# Patient Record
Sex: Female | Born: 1950
Health system: Southern US, Community
[De-identification: ages and names within clinical notes are randomized; demographics above are authoritative.]

## PROBLEM LIST (undated history)

## (undated) DIAGNOSIS — Z975 Presence of (intrauterine) contraceptive device: Secondary | ICD-10-CM

## (undated) DIAGNOSIS — Z3046 Encounter for surveillance of implantable subdermal contraceptive: Secondary | ICD-10-CM

## (undated) DIAGNOSIS — R87613 High grade squamous intraepithelial lesion on cytologic smear of cervix (HGSIL): Secondary | ICD-10-CM

## (undated) HISTORY — DX: Presence of (intrauterine) contraceptive device: Z97.5

## (undated) HISTORY — DX: High grade squamous intraepithelial lesion on cytologic smear of cervix (HGSIL): R87.613

## (undated) HISTORY — DX: Encounter for surveillance of implantable subdermal contraceptive: Z30.46

---

## 1989-03-28 DIAGNOSIS — Z975 Presence of (intrauterine) contraceptive device: Secondary | ICD-10-CM

## 1989-03-28 DIAGNOSIS — R87613 High grade squamous intraepithelial lesion on cytologic smear of cervix (HGSIL): Secondary | ICD-10-CM

## 1989-03-28 HISTORY — DX: Presence of (intrauterine) contraceptive device: Z97.5

## 1989-03-28 HISTORY — DX: High grade squamous intraepithelial lesion on cytologic smear of cervix (HGSIL): R87.613

## 1989-03-28 HISTORY — PX: CERVICAL CONE BIOPSY: SUR198

## 1995-03-29 DIAGNOSIS — Z3046 Encounter for surveillance of implantable subdermal contraceptive: Secondary | ICD-10-CM

## 1995-03-29 HISTORY — DX: Encounter for surveillance of implantable subdermal contraceptive: Z30.46

## 1999-01-06 ENCOUNTER — Other Ambulatory Visit: Admission: RE | Admit: 1999-01-06 | Discharge: 1999-01-06 | Payer: Self-pay | Admitting: Obstetrics and Gynecology

## 2000-03-03 ENCOUNTER — Other Ambulatory Visit: Admission: RE | Admit: 2000-03-03 | Discharge: 2000-03-03 | Payer: Self-pay | Admitting: Obstetrics and Gynecology

## 2001-02-09 ENCOUNTER — Other Ambulatory Visit: Admission: RE | Admit: 2001-02-09 | Discharge: 2001-02-09 | Payer: Self-pay | Admitting: Obstetrics and Gynecology

## 2002-02-12 ENCOUNTER — Other Ambulatory Visit: Admission: RE | Admit: 2002-02-12 | Discharge: 2002-02-12 | Payer: Self-pay | Admitting: Obstetrics and Gynecology

## 2003-02-18 ENCOUNTER — Other Ambulatory Visit: Admission: RE | Admit: 2003-02-18 | Discharge: 2003-02-18 | Payer: Self-pay | Admitting: Obstetrics and Gynecology

## 2004-01-30 ENCOUNTER — Ambulatory Visit (HOSPITAL_COMMUNITY): Admission: RE | Admit: 2004-01-30 | Discharge: 2004-01-30 | Payer: Self-pay | Admitting: Gastroenterology

## 2004-01-30 ENCOUNTER — Encounter (INDEPENDENT_AMBULATORY_CARE_PROVIDER_SITE_OTHER): Payer: Self-pay | Admitting: *Deleted

## 2004-04-21 ENCOUNTER — Other Ambulatory Visit: Admission: RE | Admit: 2004-04-21 | Discharge: 2004-04-21 | Payer: Self-pay | Admitting: Obstetrics and Gynecology

## 2005-08-26 ENCOUNTER — Other Ambulatory Visit: Admission: RE | Admit: 2005-08-26 | Discharge: 2005-08-26 | Payer: Self-pay | Admitting: Obstetrics and Gynecology

## 2006-11-21 ENCOUNTER — Other Ambulatory Visit: Admission: RE | Admit: 2006-11-21 | Discharge: 2006-11-21 | Payer: Self-pay | Admitting: Obstetrics and Gynecology

## 2007-12-28 ENCOUNTER — Other Ambulatory Visit: Admission: RE | Admit: 2007-12-28 | Discharge: 2007-12-28 | Payer: Self-pay | Admitting: Obstetrics and Gynecology

## 2009-05-12 LAB — HM PAP SMEAR: HM Pap smear: NEGATIVE

## 2010-08-13 NOTE — Op Note (Signed)
NAMECAMILIA, Sonya Newton                 ACCOUNT NO.:  [id number]   MEDICAL RECORD NO.:  [id number]          PATIENT TYPE:  AMB   LOCATION:  ENDO                         FACILITY:  [facility]   PHYSICIAN:  John C. Madilyn Fireman, M.D.    DATE OF BIRTH:  1950/12/24   DATE OF PROCEDURE:  01/30/2004  DATE OF DISCHARGE:                                 OPERATIVE REPORT   PROCEDURE:  Colonoscopy with polypectomy.   INDICATIONS FOR PROCEDURE:  Average risk colon cancer screening.   DESCRIPTION OF PROCEDURE:  The patient was placed in the left lateral  decubitus position then placed on the pulse monitor with continuous low flow  oxygen delivered by nasal cannula. She was sedated with 75 mcg IV fentanyl  and 8 mg IV Versed. The Olympus video colonoscope was inserted into the  rectum and advanced to the cecum, confirmed by transillumination at  McBurney's point and visualization of the ileocecal valve and appendiceal  orifice. The prep was excellent. The cecum appeared normal with no masses,  polyps, diverticula or other mucosal abnormalities.  Within the ascending  colon, there was a 6 mm polyp that was removed by hot biopsy. The remainder  of the ascending, transverse, descending and sigmoid colon appeared normal  with no masses, polyps, diverticula or other mucosal abnormalities. The  rectum likewise appeared normal and retroflexed view of the anus revealed no  obvious internal hemorrhoids. The scope was then withdrawn and the patient  returned to the recovery room in stable condition. She tolerated the  procedure well and there were no immediate complications.   IMPRESSION:  Small ascending colon polyp otherwise normal study.   PLAN:  Await histology to determine method and interval for future colon  screening.      JCH/MEDQ  D:  01/30/2004  T:  01/30/2004  Job:  [id number]   cc:   Caryn Bee L. Little, M.D.  [address]  [place]  [place] [address]  Fax: [phone]

## 2012-02-02 LAB — HM MAMMOGRAPHY: HM Mammogram: NORMAL

## 2012-05-14 ENCOUNTER — Encounter: Payer: Self-pay | Admitting: Obstetrics and Gynecology

## 2012-06-20 ENCOUNTER — Ambulatory Visit (INDEPENDENT_AMBULATORY_CARE_PROVIDER_SITE_OTHER): Payer: BC Managed Care – PPO | Admitting: Obstetrics and Gynecology

## 2012-06-20 ENCOUNTER — Other Ambulatory Visit: Payer: Self-pay | Admitting: Obstetrics and Gynecology

## 2012-06-20 ENCOUNTER — Encounter: Payer: Self-pay | Admitting: Obstetrics and Gynecology

## 2012-06-20 VITALS — BP 118/80 | Ht 63.0 in | Wt 164.0 lb

## 2012-06-20 DIAGNOSIS — Z01419 Encounter for gynecological examination (general) (routine) without abnormal findings: Secondary | ICD-10-CM

## 2012-06-20 MED ORDER — CITALOPRAM HYDROBROMIDE 20 MG PO TABS: 20.0000 mg | ORAL_TABLET | Freq: Every day | ORAL | Status: DC

## 2012-06-20 NOTE — Patient Instructions (Signed)

## 2012-06-20 NOTE — Progress Notes (Signed)
62 y.o. MarriedCaucasian female   G1P1 here for annual exam.  Has a collapsed disc in lower back but no intervention needed yet.  Still walking fast for exercise.  Tried to d/c citalopram, but had recurrence of anxiety and insomnia, so went back on it.    No LMP recorded. Patient is postmenopausal.          Sexually active: yes  The current method of family planning is vasectomy.    Exercising: }walking 4 days a week Last mammogram:  02/02/2012 normal Last pap smear: 05/12/2009 neg History of abnormal pap: 1991 CIN 3 Smoking:no Alcohol: 4-5 beers a week Last colonoscopy:2005 normal q 34yrs Last Bone Density:  none Last tetanus shot: 3 weeks ago Last cholesterol check: 3 weeks ago normal  Hgb:       pcp         Urine:pcp    Health Maintenance  Topic Date Due  . Influenza Vaccine  11/26/1950  . Pap Smear  10/17/1970  . Mammogram  10/17/2002  . Colonoscopy  10/17/2002  . Tetanus/tdap  03/29/2015    Family History  Problem Relation Age of Onset  . Hypertension Mother   . Cancer Father 85    liver, pancreatic ca  . Hypertension Brother     There is no problem list on file for this patient.   Past Medical History  Diagnosis Date  . IUD contraception 1991    removed 1991  . Encounter for Norplant removal 1997  . Pap smear abnormality of cervix with HGSIL 1991    CKC positive endocervical margin    Past Surgical History  Procedure Laterality Date  . Cervical cone biopsy  1991    Allergies: Review of patient's allergies indicates no known allergies.  Current Outpatient Prescriptions  Medication Sig Dispense Refill  . calcium carbonate (OS-CAL) 600 MG TABS Take 600 mg by mouth daily.      Marland Kitchen loratadine (CLARITIN) 10 MG tablet Take 10 mg by mouth daily.      . meloxicam (MOBIC) 7.5 MG tablet       . MULTIPLE VITAMIN PO Take 1 tablet by mouth daily.       No current facility-administered medications for this visit.    ROS: Pertinent items are noted in HPI.  Exam:     BP 118/80  Ht 5\' 3"  (1.6 m)  Wt 164 lb (74.39 kg)  BMI 29.06 kg/m2  Weight is up 7 pounds since 06/17/2011 Wt Readings from Last 3 Encounters:  06/20/12 164 lb (74.39 kg)     Ht Readings from Last 3 Encounters:  06/20/12 5\' 3"  (1.6 m)  Height is stable.    General appearance: alert, cooperative and appears stated age Head: Normocephalic, without obvious abnormality, atraumatic Neck: no adenopathy, supple, symmetrical, trachea midline and thyroid not enlarged, symmetric, no tenderness/mass/nodules Lungs: clear to auscultation bilaterally Breasts: Inspection negative, No nipple retraction or dimpling, No nipple discharge or bleeding, No axillary or supraclavicular adenopathy, Normal to palpation without dominant masses Heart: regular rate and rhythm Abdomen: soft, non-tender; bowel sounds normal; no masses,  no organomegaly Extremities: extremities normal, atraumatic, no cyanosis or edema Skin: Skin color, texture, turgor normal. No rashes 3 cm lipoma right upper back Lymph nodes: Cervical, supraclavicular, and axillary nodes normal. No abnormal inguinal nodes palpated Neurologic: Grossly normal   Pelvic: External genitalia:  no lesions              Urethra:  normal appearing urethra with no masses, tenderness  or lesions              Bartholins and Skenes: normal                 Vagina: normal appearing vagina with normal color and discharge, no lesions              Cervix: normal appearance              Pap taken: yes        Bimanual Exam:  Uterus:  uterus is normal size, shape, consistency and nontender. Mid position                                      Adnexa: normal adnexa in size, nontender and no masses                                      Rectovaginal: Confirms                                      Anus:  normal sphincter tone, no lesions  A: nl menopausal exam, no HRT     H/O CIN 3 with CKC in 1991, nl paps since.  Pap due today.     Anxiety, tx/d with citalopram      P: mammogram pap smear counseled on breast self exam, mammography screening, adequate intake of calcium and vitamin D, diet and exercise return annually or prn     An After Visit Summary was printed and given to the patient.

## 2012-06-21 LAB — IPS PAP TEST WITH HPV

## 2012-06-29 ENCOUNTER — Encounter: Payer: Self-pay | Admitting: Obstetrics and Gynecology

## 2012-12-27 ENCOUNTER — Encounter: Payer: Self-pay | Admitting: Obstetrics and Gynecology

## 2013-03-28 HISTORY — PX: CYSTECTOMY: SUR359

## 2013-06-28 ENCOUNTER — Ambulatory Visit: Payer: BC Managed Care – PPO | Admitting: Obstetrics and Gynecology

## 2013-07-01 ENCOUNTER — Ambulatory Visit: Payer: BC Managed Care – PPO | Admitting: Obstetrics and Gynecology

## 2013-07-02 ENCOUNTER — Other Ambulatory Visit: Payer: Self-pay | Admitting: *Deleted

## 2013-07-02 NOTE — Telephone Encounter (Signed)
Incoming Fax form [facility] requesting Celexa refill  Last AEX 06/20/2012 Last refill 06/20/2012 #30/12 refills  Next AEX scheduled for 11/06/2013  Ok to refill Rx until August? Paper Chart in your door.  Please approve or deny Rx.

## 2013-07-03 MED ORDER — CITALOPRAM HYDROBROMIDE 20 MG PO TABS: 20.0000 mg | ORAL_TABLET | Freq: Every day | ORAL | Status: DC

## 2013-10-31 ENCOUNTER — Telehealth: Payer: Self-pay | Admitting: Obstetrics and Gynecology

## 2013-10-31 NOTE — Telephone Encounter (Signed)
Confirming pts appt lm with man to return call

## 2013-11-06 ENCOUNTER — Encounter: Payer: Self-pay | Admitting: Obstetrics and Gynecology

## 2013-11-06 ENCOUNTER — Ambulatory Visit (INDEPENDENT_AMBULATORY_CARE_PROVIDER_SITE_OTHER): Payer: Managed Care, Other (non HMO) | Admitting: Obstetrics and Gynecology

## 2013-11-06 VITALS — BP 110/70 | HR 88 | Resp 16 | Ht 62.5 in | Wt 163.0 lb

## 2013-11-06 DIAGNOSIS — Z01419 Encounter for gynecological examination (general) (routine) without abnormal findings: Secondary | ICD-10-CM

## 2013-11-06 DIAGNOSIS — Z1211 Encounter for screening for malignant neoplasm of colon: Secondary | ICD-10-CM

## 2013-11-06 NOTE — Progress Notes (Signed)
Patient ID: Sonya Newton, female   DOB: 04/02/1950, 63 y.o.   MRN: [id number] GYNECOLOGY VISIT  PCP:   Catha Gosselin, MD  Referring provider:   HPI: 63 y.o.   Married  Caucasian  female   G1P1 with Patient's last menstrual period was 03/28/2002.   here for  AEX.  Patient on [employer].  Trying not to do a lot of testing this year.  Having back problems and had to stop running.  Treated with prednisone 12 day course and Meloxicam prn.  Walking 5 days a week.  "Taking medication for sleep." Ran out of Citalopram and was taking it prn to help with sleep. Had not taken it for a while.  Less stress not that she is not working.  Sleeping also better since back is feeling better.  Declines Rx for insomnia/anxiety.   Hgb:    PCP Urine:  PCP  GYNECOLOGIC HISTORY: Patient's last menstrual period was 03/28/2002. Sexually active:  yes Partner preference: female Contraception: vasectomy/postmenopausal   Menopausal hormone therapy:  none DES exposure:  no  Blood transfusions:   no Sexually transmitted diseases:  ?HPV GYN procedures and prior surgeries:  Conization of cervix 1991 Last mammogram:  02-12-13 WUJ:WJXBJ.          Last pap and high risk HPV testing: 06-20-12 - WNL, negative HR HPV. History of abnormal pap smear:  1991 CIN 3--Cone Biopsy--had positive margin of endocervix.  CKC--Positive endocervical margin. OB History   Grav Para Term Preterm Abortions TAB SAB Ect Mult Living   1 1        1        LIFESTYLE: Exercise:    Walks 5 miles per week          Tobacco:    Former smoker Alcohol:    6 beers per week Drug use:  No  OTHER HEALTH MAINTENANCE: Tetanus/TDap:  2014 Gardisil:            n/a Influenza:         12/2012 Zostavax:         n/a  Bone density:   Declines.  Colonoscopy:   2005 normal with [facility].  Patient is due this year.  Cholesterol check: 05/2012 normal with PCP  Family History  Problem Relation Age of Onset  . Hypertension Mother   . Cancer  Father 19    liver, pancreatic ca  . Hypertension Brother     There are no active problems to display for this patient.  Past Medical History  Diagnosis Date  . IUD contraception 1991    removed 1991  . Encounter for Norplant removal 1997  . Pap smear abnormality of cervix with HGSIL 1991    CKC positive endocervical margin    Past Surgical History  Procedure Laterality Date  . Cervical cone biopsy  1991    ALLERGIES: Review of patient's allergies indicates no known allergies.  Current Outpatient Prescriptions  Medication Sig Dispense Refill  . calcium carbonate (OS-CAL) 600 MG TABS Take 600 mg by mouth daily.      Marland Kitchen loratadine (CLARITIN) 10 MG tablet Take 10 mg by mouth daily.      . meloxicam (MOBIC) 7.5 MG tablet Take 7.5 mg by mouth as needed.       . MULTIPLE VITAMIN PO Take 1 tablet by mouth daily.      . citalopram (CELEXA) 20 MG tablet Take 1 tablet (20 mg total) by mouth daily.  30 tablet  4  .  predniSONE (STERAPRED UNI-PAK) 5 MG TABS tablet Take 1 tablet by mouth daily. Tapered dose       No current facility-administered medications for this visit.     ROS:  Pertinent items are noted in HPI.  History   Social History  . Marital Status: Married    Spouse Name: N/A    Number of Children: N/A  . Years of Education: N/A   Occupational History  . Not on file.   Social History Main Topics  . Smoking status: Former Smoker    Quit date: 06/21/1982  . Smokeless tobacco: Not on file  . Alcohol Use: 3.6 oz/week    6 Cans of beer per week     Comment: 6 beers per week  . Drug Use: No  . Sexual Activity: Yes    Partners: Male    Birth Control/ Protection: Other-see comments, Post-menopausal     Comment: vasectomy   Other Topics Concern  . Not on file   Social History Narrative  . No narrative on file    PHYSICAL EXAMINATION:    BP 110/70  Pulse 88  Resp 16  Ht 5' 2.5" (1.588 m)  Wt 163 lb (73.936 kg)  BMI 29.32 kg/m2  LMP 03/28/2002   Wt  Readings from Last 3 Encounters:  11/06/13 163 lb (73.936 kg)  06/20/12 164 lb (74.39 kg)     Ht Readings from Last 3 Encounters:  11/06/13 5' 2.5" (1.588 m)  06/20/12 5\' 3"  (1.6 m)    General appearance: alert, cooperative and appears stated age Head: Normocephalic, without obvious abnormality, atraumatic Neck: no adenopathy, supple, symmetrical, trachea midline and thyroid not enlarged, symmetric, no tenderness/mass/nodules Lungs: clear to auscultation bilaterally Breasts: Inspection negative, No nipple retraction or dimpling, No nipple discharge or bleeding, No axillary or supraclavicular adenopathy, Normal to palpation without dominant masses Heart: regular rate and rhythm Abdomen: soft, non-tender; no masses,  no organomegaly Extremities: extremities normal, atraumatic, no cyanosis or edema Skin: Skin color, texture, turgor normal. No rashes or lesions Lymph nodes: Cervical, supraclavicular, and axillary nodes normal. No abnormal inguinal nodes palpated Neurologic: Grossly normal  Pelvic: External genitalia:  no lesions              Urethra:  normal appearing urethra with no masses, tenderness or lesions              Bartholins and Skenes: normal                 Vagina: normal appearing vagina with normal color and discharge, no lesions              Cervix: normal appearance. Stenotic cervix.              Pap and high risk HPV testing done: Yes.              Bimanual Exam:  Uterus:  uterus is normal size, shape, consistency and nontender                                      Adnexa: normal adnexa in size, nontender and no masses                                      Rectovaginal: yes  Confirms above.                                      Anus:  normal sphincter tone, no lesions  ASSESSMENT  Normal gynecologic exam. History of cold knife conization with positive margin 1991 - CIN 3. History of sleep disturbance and anxiety while was working.   Due for colonoscopy but is not sure when she will do it.   PLAN  Mammogram recommended yearly starting at age 21. Pap smear and high risk HPV testing as above. Counseled on self breast exam, Calcium and vitamin D intake, exercise. See lab orders yes.  IFOB. Declines Rx for insomnia.  Re-educated that Citalopram is not a sleeping aid to be used prn but that there are other options available. Return annually or prn   An After Visit Summary was printed and given to the patient.

## 2013-11-06 NOTE — Patient Instructions (Signed)

## 2013-11-08 LAB — IPS PAP TEST WITH HPV

## 2013-11-18 ENCOUNTER — Telehealth: Payer: Self-pay

## 2013-11-18 NOTE — Telephone Encounter (Signed)
Message copied by Jannet Askew on Mon Nov 18, 2013  5:06 PM ------      Message from: Ricki Miller DE Gwenevere Ghazi, BROOK E      Created: Sun Nov 17, 2013  3:39 PM       Please give the patient a gentle reminder to send in the IFOB - hemoccult stool testing.             Thanks,             Conley Simmonds      ----- Message -----         From: SYSTEM         Sent: 11/11/2013  12:01 AM           To: Brook E Amundson de Gwenevere Ghazi, MD                   ------

## 2013-11-18 NOTE — Telephone Encounter (Signed)
Spoke with patient. Advised of reminder as seen below from Dr.Silva. Patient is agreeable and verbalizes understanding.  Routing to provider for final review. Patient agreeable to disposition. Will close encounter

## 2014-01-27 ENCOUNTER — Encounter: Payer: Self-pay | Admitting: Obstetrics and Gynecology

## 2014-02-14 ENCOUNTER — Telehealth: Payer: Self-pay | Admitting: Obstetrics and Gynecology

## 2014-02-14 NOTE — Telephone Encounter (Signed)
Left message regarding appointment 10/2014 has been canceled and needs to be rescheduled.

## 2014-11-14 ENCOUNTER — Ambulatory Visit: Payer: Managed Care, Other (non HMO) | Admitting: Obstetrics and Gynecology

## 2014-11-17 ENCOUNTER — Ambulatory Visit: Payer: Managed Care, Other (non HMO) | Admitting: Obstetrics and Gynecology

## 2015-02-05 ENCOUNTER — Encounter: Payer: Self-pay | Admitting: Obstetrics and Gynecology

## 2015-02-05 ENCOUNTER — Ambulatory Visit (INDEPENDENT_AMBULATORY_CARE_PROVIDER_SITE_OTHER): Payer: 59 | Admitting: Obstetrics and Gynecology

## 2015-02-05 VITALS — BP 130/90 | HR 90 | Resp 18 | Ht 62.5 in | Wt 163.8 lb

## 2015-02-05 DIAGNOSIS — Z01419 Encounter for gynecological examination (general) (routine) without abnormal findings: Secondary | ICD-10-CM | POA: Diagnosis not present

## 2015-02-05 NOTE — Progress Notes (Signed)
Patient ID: Sonya Newton, female   DOB: Dec 06, 1950, 64 y.o.   MRN: [id number] 64 y.o. G1P1 Married Caucasian female here for annual exam.    Denies vaginal bleeding or spotting.  Having vaginal dryness.  Back to running again.  Has run a marathon in the past.   [place]  Mother is 683 and doing well.   PCP:  Catha GosselinKevin Little, MD  Patient's last menstrual period was 03/28/2002.          Sexually active: Yes.   female The current method of family planning is vasectomy.    Exercising: Yes.    Runs 15-20 miles weekly. Smoker:  former  Health Maintenance: Pap:  11-06-13 Neg:Neg HR HPV History of abnormal Pap:  Yes, 1991 Hx cone Bx -- CIN 3 and had positive margin of endocervix.  CKC--positive endocervical margin. MMG:  02-19-14 Density Cat.C/benign calcifications Rt.breast,benign lymph node Lt.breast/Neg/BiRads2:[facility].  Has appointment for the end of November 2016. Colonoscopy:  2014 normal with [facility].  Next scheduled for 06/2015. BMD:   never  Result  n/a TDaP:  2014 Screening Labs:  Hb today: PCP, Urine today: PCP   reports that she quit smoking about 32 years ago. She does not have any smokeless tobacco history on file. She reports that she drinks about 3.6 oz of alcohol per week. She reports that she does not use illicit drugs.  Past Medical History  Diagnosis Date  . IUD contraception 1991    removed 1991  . Encounter for Norplant removal 1997  . Pap smear abnormality of cervix with HGSIL 1991    CKC positive endocervical margin    Past Surgical History  Procedure Laterality Date  . Cervical cone biopsy  1991  . Cystectomy  2015    --benign back cyst    Current Outpatient Prescriptions  Medication Sig Dispense Refill  . calcium carbonate (OS-CAL) 600 MG TABS Take 600 mg by mouth daily.    Marland Kitchen. loratadine (CLARITIN) 10 MG tablet Take 10 mg by mouth daily.    . MULTIPLE VITAMIN PO Take 1 tablet by mouth daily.     No current facility-administered medications for  this visit.    Family History  Problem Relation Age of Onset  . Hypertension Mother   . Cancer Father [id number]    liver, pancreatic ca  . Hypertension Brother     ROS:  Pertinent items are noted in HPI.  Otherwise, a comprehensive ROS was negative.  Exam:   BP 130/90 mmHg  Pulse 90  Resp 18  Ht 5' 2.5" (1.588 m)  Wt 163 lb 12.8 oz (74.299 kg)  BMI 29.46 kg/m2  LMP 03/28/2002    General appearance: alert, cooperative and appears stated age Head: Normocephalic, without obvious abnormality, atraumatic Neck: no adenopathy, supple, symmetrical, trachea midline and thyroid normal to inspection and palpation Lungs: clear to auscultation bilaterally Breasts: normal appearance, no masses or tenderness, Inspection negative, No nipple retraction or dimpling, No nipple discharge or bleeding, No axillary or supraclavicular adenopathy Heart: regular rate and rhythm Abdomen: soft, non-tender; bowel sounds normal; no masses,  no organomegaly Extremities: extremities normal, atraumatic, no cyanosis or edema Skin: Skin color, texture, turgor normal. No rashes or lesions Lymph nodes: Cervical, supraclavicular, and axillary nodes normal. No abnormal inguinal nodes palpated Neurologic: Grossly normal  Pelvic: External genitalia:  no lesions              Urethra:  normal appearing urethra with no masses, tenderness or lesions  Bartholins and Skenes: normal                 Vagina: normal appearing vagina with normal color and discharge, no lesions              Cervix: no lesions and atrophic and bleeding with pap.              Pap taken: Yes.   Bimanual Exam:  Uterus:  normal size, contour, position, consistency, mobility, non-tender              Adnexa: normal adnexa and no mass, fullness, tenderness              Rectovaginal: Yes.  .  Confirms.              Anus:  normal sphincter tone, no lesions  Chaperone was present for exam.  Assessment:   Well woman visit with normal exam. Hx  of cold knife conization with CIN III and positive margin 1991.  Plan: Yearly mammogram recommended after age 65.  Recommended self breast exam.  Pap and HR HPV as above.   If this pap is normal, next pap in 3 years. Discussed Calcium, Vitamin D, regular exercise program including cardiovascular and weight bearing exercise. Labs performed.  No..     Refills given on medications.  No..    Follow up annually and prn.      After visit summary provided.

## 2015-02-05 NOTE — Patient Instructions (Signed)

## 2015-02-11 LAB — IPS PAP TEST WITH HPV

## 2017-11-01 DIAGNOSIS — K219 Gastro-esophageal reflux disease without esophagitis: Secondary | ICD-10-CM | POA: Diagnosis not present

## 2017-11-01 DIAGNOSIS — Z79899 Other long term (current) drug therapy: Secondary | ICD-10-CM | POA: Diagnosis not present

## 2017-11-01 DIAGNOSIS — R7301 Impaired fasting glucose: Secondary | ICD-10-CM | POA: Diagnosis not present

## 2017-11-01 DIAGNOSIS — J309 Allergic rhinitis, unspecified: Secondary | ICD-10-CM | POA: Diagnosis not present

## 2017-11-01 DIAGNOSIS — E785 Hyperlipidemia, unspecified: Secondary | ICD-10-CM | POA: Diagnosis not present

## 2017-11-01 DIAGNOSIS — Z0000 Encounter for general adult medical examination without abnormal findings: Secondary | ICD-10-CM | POA: Diagnosis not present

## 2017-11-01 DIAGNOSIS — R8290 Unspecified abnormal findings in urine: Secondary | ICD-10-CM | POA: Diagnosis not present

## 2017-11-01 DIAGNOSIS — Z23 Encounter for immunization: Secondary | ICD-10-CM | POA: Diagnosis not present

## 2017-11-01 DIAGNOSIS — Z136 Encounter for screening for cardiovascular disorders: Secondary | ICD-10-CM | POA: Diagnosis not present

## 2017-12-13 DIAGNOSIS — R69 Illness, unspecified: Secondary | ICD-10-CM | POA: Diagnosis not present

## 2018-02-14 DIAGNOSIS — E785 Hyperlipidemia, unspecified: Secondary | ICD-10-CM | POA: Diagnosis not present

## 2018-02-14 DIAGNOSIS — Z79899 Other long term (current) drug therapy: Secondary | ICD-10-CM | POA: Diagnosis not present

## 2018-04-10 DIAGNOSIS — Z1231 Encounter for screening mammogram for malignant neoplasm of breast: Secondary | ICD-10-CM | POA: Diagnosis not present

## 2018-09-24 DIAGNOSIS — H5213 Myopia, bilateral: Secondary | ICD-10-CM | POA: Diagnosis not present

## 2018-10-01 DIAGNOSIS — Z0100 Encounter for examination of eyes and vision without abnormal findings: Secondary | ICD-10-CM | POA: Diagnosis not present

## 2018-11-07 DIAGNOSIS — E785 Hyperlipidemia, unspecified: Secondary | ICD-10-CM | POA: Diagnosis not present

## 2018-11-07 DIAGNOSIS — R7301 Impaired fasting glucose: Secondary | ICD-10-CM | POA: Diagnosis not present

## 2018-11-07 DIAGNOSIS — K219 Gastro-esophageal reflux disease without esophagitis: Secondary | ICD-10-CM | POA: Diagnosis not present

## 2018-11-07 DIAGNOSIS — J309 Allergic rhinitis, unspecified: Secondary | ICD-10-CM | POA: Diagnosis not present

## 2018-11-07 DIAGNOSIS — Z79899 Other long term (current) drug therapy: Secondary | ICD-10-CM | POA: Diagnosis not present

## 2018-12-10 DIAGNOSIS — R69 Illness, unspecified: Secondary | ICD-10-CM | POA: Diagnosis not present

## 2019-09-09 DIAGNOSIS — Z1231 Encounter for screening mammogram for malignant neoplasm of breast: Secondary | ICD-10-CM | POA: Diagnosis not present

## 2019-11-01 DIAGNOSIS — E785 Hyperlipidemia, unspecified: Secondary | ICD-10-CM | POA: Diagnosis not present

## 2019-11-01 DIAGNOSIS — R8290 Unspecified abnormal findings in urine: Secondary | ICD-10-CM | POA: Diagnosis not present

## 2019-11-01 DIAGNOSIS — Z1159 Encounter for screening for other viral diseases: Secondary | ICD-10-CM | POA: Diagnosis not present

## 2019-11-01 DIAGNOSIS — Z79899 Other long term (current) drug therapy: Secondary | ICD-10-CM | POA: Diagnosis not present

## 2019-11-01 DIAGNOSIS — Z0000 Encounter for general adult medical examination without abnormal findings: Secondary | ICD-10-CM | POA: Diagnosis not present

## 2019-11-01 DIAGNOSIS — J309 Allergic rhinitis, unspecified: Secondary | ICD-10-CM | POA: Diagnosis not present

## 2019-11-01 DIAGNOSIS — K219 Gastro-esophageal reflux disease without esophagitis: Secondary | ICD-10-CM | POA: Diagnosis not present

## 2019-11-01 DIAGNOSIS — Z13820 Encounter for screening for osteoporosis: Secondary | ICD-10-CM | POA: Diagnosis not present

## 2019-11-01 DIAGNOSIS — Z23 Encounter for immunization: Secondary | ICD-10-CM | POA: Diagnosis not present

## 2019-11-01 DIAGNOSIS — R7301 Impaired fasting glucose: Secondary | ICD-10-CM | POA: Diagnosis not present

## 2019-11-28 DIAGNOSIS — K644 Residual hemorrhoidal skin tags: Secondary | ICD-10-CM | POA: Diagnosis not present

## 2019-12-21 ENCOUNTER — Emergency Department (HOSPITAL_COMMUNITY): Payer: Medicare HMO

## 2019-12-21 ENCOUNTER — Emergency Department (HOSPITAL_COMMUNITY)
Admission: EM | Admit: 2019-12-21 | Discharge: 2019-12-21 | Disposition: A | Discharge status: Home / Self Care | Payer: Medicare HMO | Attending: Emergency Medicine | Admitting: Emergency Medicine

## 2019-12-21 ENCOUNTER — Encounter (HOSPITAL_COMMUNITY): Payer: Self-pay | Admitting: Emergency Medicine

## 2019-12-21 DIAGNOSIS — S4992XA Unspecified injury of left shoulder and upper arm, initial encounter: Secondary | ICD-10-CM | POA: Diagnosis present

## 2019-12-21 DIAGNOSIS — W19XXXA Unspecified fall, initial encounter: Secondary | ICD-10-CM | POA: Diagnosis not present

## 2019-12-21 DIAGNOSIS — W01198A Fall on same level from slipping, tripping and stumbling with subsequent striking against other object, initial encounter: Secondary | ICD-10-CM | POA: Insufficient documentation

## 2019-12-21 DIAGNOSIS — Y998 Other external cause status: Secondary | ICD-10-CM | POA: Insufficient documentation

## 2019-12-21 DIAGNOSIS — S199XXA Unspecified injury of neck, initial encounter: Secondary | ICD-10-CM | POA: Diagnosis not present

## 2019-12-21 DIAGNOSIS — M25519 Pain in unspecified shoulder: Secondary | ICD-10-CM | POA: Diagnosis not present

## 2019-12-21 DIAGNOSIS — R079 Chest pain, unspecified: Secondary | ICD-10-CM | POA: Diagnosis not present

## 2019-12-21 DIAGNOSIS — Y9289 Other specified places as the place of occurrence of the external cause: Secondary | ICD-10-CM | POA: Diagnosis not present

## 2019-12-21 DIAGNOSIS — S42032A Displaced fracture of lateral end of left clavicle, initial encounter for closed fracture: Secondary | ICD-10-CM

## 2019-12-21 DIAGNOSIS — Y93G9 Activity, other involving cooking and grilling: Secondary | ICD-10-CM | POA: Insufficient documentation

## 2019-12-21 DIAGNOSIS — R55 Syncope and collapse: Secondary | ICD-10-CM | POA: Diagnosis not present

## 2019-12-21 DIAGNOSIS — S2232XA Fracture of one rib, left side, initial encounter for closed fracture: Secondary | ICD-10-CM | POA: Diagnosis not present

## 2019-12-21 DIAGNOSIS — S2231XA Fracture of one rib, right side, initial encounter for closed fracture: Secondary | ICD-10-CM | POA: Diagnosis not present

## 2019-12-21 DIAGNOSIS — R52 Pain, unspecified: Secondary | ICD-10-CM | POA: Diagnosis not present

## 2019-12-21 DIAGNOSIS — M47814 Spondylosis without myelopathy or radiculopathy, thoracic region: Secondary | ICD-10-CM | POA: Diagnosis not present

## 2019-12-21 DIAGNOSIS — S42002A Fracture of unspecified part of left clavicle, initial encounter for closed fracture: Secondary | ICD-10-CM | POA: Diagnosis not present

## 2019-12-21 DIAGNOSIS — S2242XA Multiple fractures of ribs, left side, initial encounter for closed fracture: Secondary | ICD-10-CM | POA: Diagnosis not present

## 2019-12-21 DIAGNOSIS — S0990XA Unspecified injury of head, initial encounter: Secondary | ICD-10-CM | POA: Diagnosis not present

## 2019-12-21 LAB — CBC
HCT: 41.5 % (ref 36.0–46.0)
Hemoglobin: 13.2 g/dL (ref 12.0–15.0)
MCH: 31 pg (ref 26.0–34.0)
MCHC: 31.8 g/dL (ref 30.0–36.0)
MCV: 97.4 fL (ref 80.0–100.0)
Platelets: 270 10*3/uL (ref 150–400)
RBC: 4.26 MIL/uL (ref 3.87–5.11)
RDW: 12.5 % (ref 11.5–15.5)
WBC: 8.7 10*3/uL (ref 4.0–10.5)
nRBC: 0 % (ref 0.0–0.2)

## 2019-12-21 LAB — TROPONIN I (HIGH SENSITIVITY)
Troponin I (High Sensitivity): 4 ng/L (ref <18)
Troponin I (High Sensitivity): 4 ng/L (ref <18)

## 2019-12-21 LAB — BASIC METABOLIC PANEL
Anion gap: 9 (ref 5–15)
BUN: 9 mg/dL (ref 8–23)
CO2: 25 mmol/L (ref 22–32)
Calcium: 9.2 mg/dL (ref 8.9–10.3)
Chloride: 108 mmol/L (ref 98–111)
Creatinine, Ser: 0.69 mg/dL (ref 0.44–1.00)
GFR calc Af Amer: >60 mL/min (ref >60)
GFR calc non Af Amer: >60 mL/min (ref >60)
Glucose, Bld: 137 mg/dL — ABNORMAL HIGH (ref 70–99)
Potassium: 4.2 mmol/L (ref 3.5–5.1)
Sodium: 142 mmol/L (ref 135–145)

## 2019-12-21 MED ORDER — ONDANSETRON HCL 4 MG/2ML IJ SOLN
4.0000 mg | Freq: Once | INTRAMUSCULAR | Status: AC
  Administered 2019-12-21: 4 mg via INTRAVENOUS
  Filled 2019-12-21: qty 2

## 2019-12-21 MED ORDER — HYDROCODONE-ACETAMINOPHEN 5-325 MG PO TABS: 1.0000 | ORAL_TABLET | Freq: Four times a day (QID) | ORAL | 0 refills | Status: AC | PRN

## 2019-12-21 MED ORDER — MORPHINE SULFATE (PF) 4 MG/ML IV SOLN
4.0000 mg | Freq: Once | INTRAVENOUS | Status: AC
  Administered 2019-12-21: 4 mg via INTRAVENOUS
  Filled 2019-12-21: qty 1

## 2019-12-21 NOTE — ED Triage Notes (Signed)
EMS stated, she was cooking breakfast and went right out. complain of left shoulder , collar bone pain and an abrasion on the head.

## 2019-12-21 NOTE — ED Notes (Signed)
Patient verbalizes understanding of discharge instructions. Opportunity for questioning and answers were provided. Pt discharged from ED. 

## 2019-12-21 NOTE — Progress Notes (Signed)
Orthopedic Tech Progress Note Patient Details:  MAREENA CAVAN [birth year] [id number]  Ortho Devices Type of Ortho Device: Shoulder immobilizer Ortho Device/Splint Location: Left Upper Extremity Ortho Device/Splint Interventions: Ordered, Application   Post Interventions Patient Tolerated: Well Instructions Provided: Adjustment of device, Care of device, Poper ambulation with device   Mallery Harshman P Harle Stanford 12/21/2019, 3:40 PM

## 2019-12-21 NOTE — ED Provider Notes (Signed)
[facility] EMERGENCY DEPARTMENT Provider Note   CSN: [id number] Arrival date & time: 12/21/19  1941     History Chief Complaint  Patient presents with  . Fall  . Shoulder Pain    left  . Head Injury  . Loss of Consciousness    Sonya Newton is a 69 y.o. female.  The history is provided by the patient and medical records. No language interpreter was used.  Fall  Shoulder Pain Head Injury Loss of Consciousness  Sonya Newton is a 69 y.o. female who presents to the Emergency Department complaining of syncope, shoulder injury. She presents the emergency department by EMS from home following a syncopal event that occurred this morning. At 8 AM she was cooking breakfast when she developed diaphoresis and dizziness. She then syncopal eyes, falling to the ground and striking her head and left shoulder. When she came to she did have some persistent dizziness, which is now resolved. She complains of severe pain to her left neck, shoulder and chest wall. She denies any recent illnesses. She has a history of hyperlipidemia, no additional medical problems. No known sick contacts. She has been vaccinated for COVID-19. She is active and runs 3 miles daily. She is right-hand dominant.    Past Medical History:  Diagnosis Date  . Encounter for Norplant removal 1997  . IUD contraception 1991   removed 1991  . Pap smear abnormality of cervix with HGSIL 1991   CKC positive endocervical margin    There are no problems to display for this patient.   Past Surgical History:  Procedure Laterality Date  . CERVICAL CONE BIOPSY  1991  . CYSTECTOMY  2015   --benign back cyst     OB History    Gravida  1   Para  1   Term      Preterm      AB      Living  1     SAB      TAB      Ectopic      Multiple      Live Births              Family History  Problem Relation Age of Onset  . Hypertension Mother   . Cancer Father 47       liver, pancreatic ca  .  Hypertension Brother     Social History   Tobacco Use  . Smoking status: Former Smoker    Quit date: 06/21/1982    Years since quitting: 37.5  . Smokeless tobacco: Never Used  Substance Use Topics  . Alcohol use: Yes    Alcohol/week: 6.0 standard drinks    Types: 6 Cans of beer per week    Comment: 6 beers per week  . Drug use: No    Home Medications Prior to Admission medications   Medication Sig Start Date End Date Taking? Authorizing Provider  calcium carbonate (OS-CAL) 600 MG TABS Take 600 mg by mouth daily.   Yes [provider]  Cholecalciferol (VITAMIN D) 50 MCG (2000 UT) CAPS Take 1 capsule by mouth daily.   Yes [provider]  Famotidine (PEPCID PO) Take 1 tablet by mouth daily.   Yes [provider]  loratadine (CLARITIN) 10 MG tablet Take 10 mg by mouth daily.   Yes [provider]  MULTIPLE VITAMIN PO Take 1 tablet by mouth daily.   Yes [provider]  rosuvastatin (CRESTOR) 5 MG tablet  Take 5 mg by mouth daily. 11/11/19  Yes [provider]  HYDROcodone-acetaminophen (NORCO/VICODIN) 5-325 MG tablet Take 1 tablet by mouth every 6 (six) hours as needed. 12/21/19   Tilden Fossa, MD    Allergies    Shrimp [shellfish allergy]  Review of Systems   Review of Systems  Cardiovascular: Positive for syncope.  All other systems reviewed and are negative.   Physical Exam Updated Vital Signs BP 135/84   Pulse 92   Temp 97.7 F (36.5 C) (Oral)   Resp 19   LMP 03/28/2002   SpO2 100%   Physical Exam Vitals and nursing note reviewed.  Constitutional:      Appearance: She is well-developed.  HENT:     Head: Normocephalic.     Comments: Superficial abrasion to central forehead Cardiovascular:     Rate and Rhythm: Normal rate and regular rhythm.     Heart sounds: No murmur heard.   Pulmonary:     Effort: Pulmonary effort is normal. No respiratory distress.     Breath sounds: Normal breath sounds.    Abdominal:     Palpations: Abdomen is soft.     Tenderness: There is no abdominal tenderness. There is no guarding or rebound.  Musculoskeletal:        General: Tenderness present.     Comments: 2+ radial pulses. There is tenderness to palpation over the left lateral neck, left clavicle, left shoulder and left upper arm. There is no tenderness over the left elbow, wrist. Five out of five grip strength bilaterally. There is mild left sided chest wall tenderness.  Skin:    General: Skin is warm and dry.  Neurological:     Mental Status: She is alert and oriented to person, place, and time.  Psychiatric:        Behavior: Behavior normal.     ED Results / Procedures / Treatments   Labs (all labs ordered are listed, but only abnormal results are displayed) Labs Reviewed  BASIC METABOLIC PANEL - Abnormal; Notable for the following components:      Result Value   Glucose, Bld 137 (*)    All other components within normal limits  CBC  URINALYSIS, ROUTINE W REFLEX MICROSCOPIC  CBG MONITORING, ED  TROPONIN I (HIGH SENSITIVITY)  TROPONIN I (HIGH SENSITIVITY)    EKG EKG Interpretation  Date/Time:  Saturday December 21 2019 09:40:02 EDT Ventricular Rate:  64 PR Interval:  152 QRS Duration: 82 QT Interval:  418 QTC Calculation: 431 R Axis:   38 Text Interpretation: Normal sinus rhythm with sinus arrhythmia Normal ECG No previous ECGs available Confirmed by Tilden Fossa [phone]) on 12/21/2019 11:18:15 AM   Radiology DG Chest 2 View  Result Date: 12/21/2019 CLINICAL DATA:  Recent fall with left-sided chest pain, initial encounter EXAM: CHEST - 2 VIEW COMPARISON:  None. FINDINGS: Cardiac shadows within normal limits. The lungs are clear bilaterally. Distal left clavicular fracture is again noted. Degenerative changes of the thoracic spine are seen. No other focal abnormality is noted. IMPRESSION: Distal left clavicular fracture. No other focal abnormality is noted. Electronically  Signed   By: Alcide Clever M.D.   On: 12/21/2019 13:18   CT Head Wo Contrast  Result Date: 12/21/2019 CLINICAL DATA:  Syncope with forehead injury this morning EXAM: CT HEAD WITHOUT CONTRAST CT CERVICAL SPINE WITHOUT CONTRAST TECHNIQUE: Multidetector CT imaging of the head and cervical spine was performed following the standard protocol without intravenous contrast. Multiplanar CT image reconstructions of the cervical  spine were also generated. COMPARISON:  None. FINDINGS: CT HEAD FINDINGS Brain: No evidence of acute infarction, hemorrhage, hydrocephalus, extra-axial collection or mass lesion/mass effect. Vascular: No hyperdense vessel or unexpected calcification. Skull: Normal. Negative for fracture or focal lesion. Sinuses/Orbits: No acute finding. CT CERVICAL SPINE FINDINGS Alignment: Normal Skull base and vertebrae: Negative for cervical spine fracture. Nondisplaced left posterior second rib fracture, see coronal reformats. Presumed bone island at the left occipital condyle. Soft tissues and spinal canal: No prevertebral fluid or swelling. No visible canal hematoma. Left hemithyroidectomy. Disc levels:  Ordinary degenerative changes. Upper chest: No apical pneumothorax associated with the fracture. IMPRESSION: 1. No evidence of intracranial or cervical spine injury. 2. Nondisplaced left second rib fracture. Electronically Signed   By: Marnee Spring M.D.   On: 12/21/2019 12:37   CT Cervical Spine Wo Contrast  Result Date: 12/21/2019 CLINICAL DATA:  Syncope with forehead injury this morning EXAM: CT HEAD WITHOUT CONTRAST CT CERVICAL SPINE WITHOUT CONTRAST TECHNIQUE: Multidetector CT imaging of the head and cervical spine was performed following the standard protocol without intravenous contrast. Multiplanar CT image reconstructions of the cervical spine were also generated. COMPARISON:  None. FINDINGS: CT HEAD FINDINGS Brain: No evidence of acute infarction, hemorrhage, hydrocephalus, extra-axial  collection or mass lesion/mass effect. Vascular: No hyperdense vessel or unexpected calcification. Skull: Normal. Negative for fracture or focal lesion. Sinuses/Orbits: No acute finding. CT CERVICAL SPINE FINDINGS Alignment: Normal Skull base and vertebrae: Negative for cervical spine fracture. Nondisplaced left posterior second rib fracture, see coronal reformats. Presumed bone island at the left occipital condyle. Soft tissues and spinal canal: No prevertebral fluid or swelling. No visible canal hematoma. Left hemithyroidectomy. Disc levels:  Ordinary degenerative changes. Upper chest: No apical pneumothorax associated with the fracture. IMPRESSION: 1. No evidence of intracranial or cervical spine injury. 2. Nondisplaced left second rib fracture. Electronically Signed   By: Marnee Spring M.D.   On: 12/21/2019 12:37   DG Shoulder Left  Result Date: 12/21/2019 CLINICAL DATA:  Recent fall with left-sided chest pain, initial encounter EXAM: LEFT SHOULDER - 2+ VIEW COMPARISON:  None. FINDINGS: Distal left clavicular fracture is noted with downward displacement of the distal fracture fragment. Humeral head is well seated. Underlying bony thorax is within normal limits. IMPRESSION: Distal left clavicular fracture as described. Electronically Signed   By: Alcide Clever M.D.   On: 12/21/2019 13:14    Procedures Procedures (including critical care time)  Medications Ordered in ED Medications  morphine 4 MG/ML injection 4 mg (4 mg Intravenous Given 12/21/19 1352)  ondansetron (ZOFRAN) injection 4 mg (4 mg Intravenous Given 12/21/19 1352)    ED Course  I have reviewed the triage vital signs and the nursing notes.  Pertinent labs & imaging results that were available during my care of the patient were reviewed by me and considered in my medical decision making (see chart for details).    MDM Rules/Calculators/A&P                         Patient here for evaluation of injuries following a syncopal event.  She did have a prodrome prior to sink apprising. Imaging is significant for clavicle fracture, rib fracture. She has no splinting on examination. EKG without acute ischemic changes. Troponin is negative times two and she is not orthostatic. Presentation is not consistent with ACS, PE, cardiogenic syncope. Discussed with patient findings of studies. Providing sling for comfort. Discussed orthopedics as well as PCP follow-up regarding  syncope.  Final Clinical Impression(s) / ED Diagnoses Final diagnoses:  Syncope, unspecified syncope type  Closed displaced fracture of acromial end of left clavicle, initial encounter  Closed fracture of one rib of left side, initial encounter    Rx / DC Orders ED Discharge Orders         Ordered    HYDROcodone-acetaminophen (NORCO/VICODIN) 5-325 MG tablet  Every 6 hours PRN        12/21/19 1547           Tilden Fossaees, Florentina Marquart, MD 12/21/19 [phone]

## 2019-12-21 NOTE — ED Triage Notes (Signed)
IV 20g in left hand  Fentanyl  100 mcg  At 920.

## 2019-12-23 DIAGNOSIS — S42035A Nondisplaced fracture of lateral end of left clavicle, initial encounter for closed fracture: Secondary | ICD-10-CM | POA: Diagnosis not present

## 2019-12-23 DIAGNOSIS — M25512 Pain in left shoulder: Secondary | ICD-10-CM | POA: Diagnosis not present

## 2019-12-30 DIAGNOSIS — S42035A Nondisplaced fracture of lateral end of left clavicle, initial encounter for closed fracture: Secondary | ICD-10-CM | POA: Diagnosis not present

## 2019-12-31 DIAGNOSIS — R69 Illness, unspecified: Secondary | ICD-10-CM | POA: Diagnosis not present

## 2020-01-09 DIAGNOSIS — S42035A Nondisplaced fracture of lateral end of left clavicle, initial encounter for closed fracture: Secondary | ICD-10-CM | POA: Diagnosis not present

## 2020-01-23 DIAGNOSIS — S42035A Nondisplaced fracture of lateral end of left clavicle, initial encounter for closed fracture: Secondary | ICD-10-CM | POA: Diagnosis not present

## 2020-02-06 DIAGNOSIS — S42035A Nondisplaced fracture of lateral end of left clavicle, initial encounter for closed fracture: Secondary | ICD-10-CM | POA: Diagnosis not present

## 2020-08-03 DIAGNOSIS — H43812 Vitreous degeneration, left eye: Secondary | ICD-10-CM | POA: Diagnosis not present

## 2020-08-18 DIAGNOSIS — H43812 Vitreous degeneration, left eye: Secondary | ICD-10-CM | POA: Diagnosis not present

## 2020-09-10 DIAGNOSIS — Z1231 Encounter for screening mammogram for malignant neoplasm of breast: Secondary | ICD-10-CM | POA: Diagnosis not present

## 2020-09-22 DIAGNOSIS — Z6829 Body mass index (BMI) 29.0-29.9, adult: Secondary | ICD-10-CM | POA: Diagnosis not present

## 2020-09-22 DIAGNOSIS — Z01419 Encounter for gynecological examination (general) (routine) without abnormal findings: Secondary | ICD-10-CM | POA: Diagnosis not present

## 2020-09-22 DIAGNOSIS — Z124 Encounter for screening for malignant neoplasm of cervix: Secondary | ICD-10-CM | POA: Diagnosis not present

## 2020-09-22 DIAGNOSIS — Z01411 Encounter for gynecological examination (general) (routine) with abnormal findings: Secondary | ICD-10-CM | POA: Diagnosis not present

## 2020-09-22 DIAGNOSIS — K644 Residual hemorrhoidal skin tags: Secondary | ICD-10-CM | POA: Diagnosis not present

## 2020-09-22 DIAGNOSIS — L900 Lichen sclerosus et atrophicus: Secondary | ICD-10-CM | POA: Diagnosis not present

## 2020-10-13 DIAGNOSIS — L900 Lichen sclerosus et atrophicus: Secondary | ICD-10-CM | POA: Diagnosis not present

## 2020-10-13 DIAGNOSIS — K644 Residual hemorrhoidal skin tags: Secondary | ICD-10-CM | POA: Diagnosis not present

## 2020-11-26 DIAGNOSIS — Z0000 Encounter for general adult medical examination without abnormal findings: Secondary | ICD-10-CM | POA: Diagnosis not present

## 2020-11-26 DIAGNOSIS — Z23 Encounter for immunization: Secondary | ICD-10-CM | POA: Diagnosis not present

## 2020-11-26 DIAGNOSIS — Z79899 Other long term (current) drug therapy: Secondary | ICD-10-CM | POA: Diagnosis not present

## 2020-11-26 DIAGNOSIS — E785 Hyperlipidemia, unspecified: Secondary | ICD-10-CM | POA: Diagnosis not present

## 2020-11-26 DIAGNOSIS — Z131 Encounter for screening for diabetes mellitus: Secondary | ICD-10-CM | POA: Diagnosis not present

## 2021-04-06 DIAGNOSIS — L723 Sebaceous cyst: Secondary | ICD-10-CM | POA: Diagnosis not present

## 2021-10-25 DIAGNOSIS — H2513 Age-related nuclear cataract, bilateral: Secondary | ICD-10-CM | POA: Diagnosis not present

## 2021-11-10 DIAGNOSIS — Z6828 Body mass index (BMI) 28.0-28.9, adult: Secondary | ICD-10-CM | POA: Diagnosis not present

## 2021-11-10 DIAGNOSIS — Z01419 Encounter for gynecological examination (general) (routine) without abnormal findings: Secondary | ICD-10-CM | POA: Diagnosis not present

## 2021-11-10 DIAGNOSIS — Z1231 Encounter for screening mammogram for malignant neoplasm of breast: Secondary | ICD-10-CM | POA: Diagnosis not present

## 2021-12-08 DIAGNOSIS — Z79899 Other long term (current) drug therapy: Secondary | ICD-10-CM | POA: Diagnosis not present

## 2021-12-08 DIAGNOSIS — E785 Hyperlipidemia, unspecified: Secondary | ICD-10-CM | POA: Diagnosis not present

## 2021-12-08 DIAGNOSIS — Z131 Encounter for screening for diabetes mellitus: Secondary | ICD-10-CM | POA: Diagnosis not present

## 2021-12-08 DIAGNOSIS — Z0000 Encounter for general adult medical examination without abnormal findings: Secondary | ICD-10-CM | POA: Diagnosis not present

## 2022-01-24 DIAGNOSIS — U071 COVID-19: Secondary | ICD-10-CM | POA: Diagnosis not present

## 2022-02-19 IMAGING — DX DG CHEST 2V
2 series · 2 of 2 positions shown · non-contrast
Comparison: None.

CLINICAL DATA: Recent fall with left-sided chest pain, initial
encounter

EXAM:
CHEST - 2 VIEW

[chest lat]
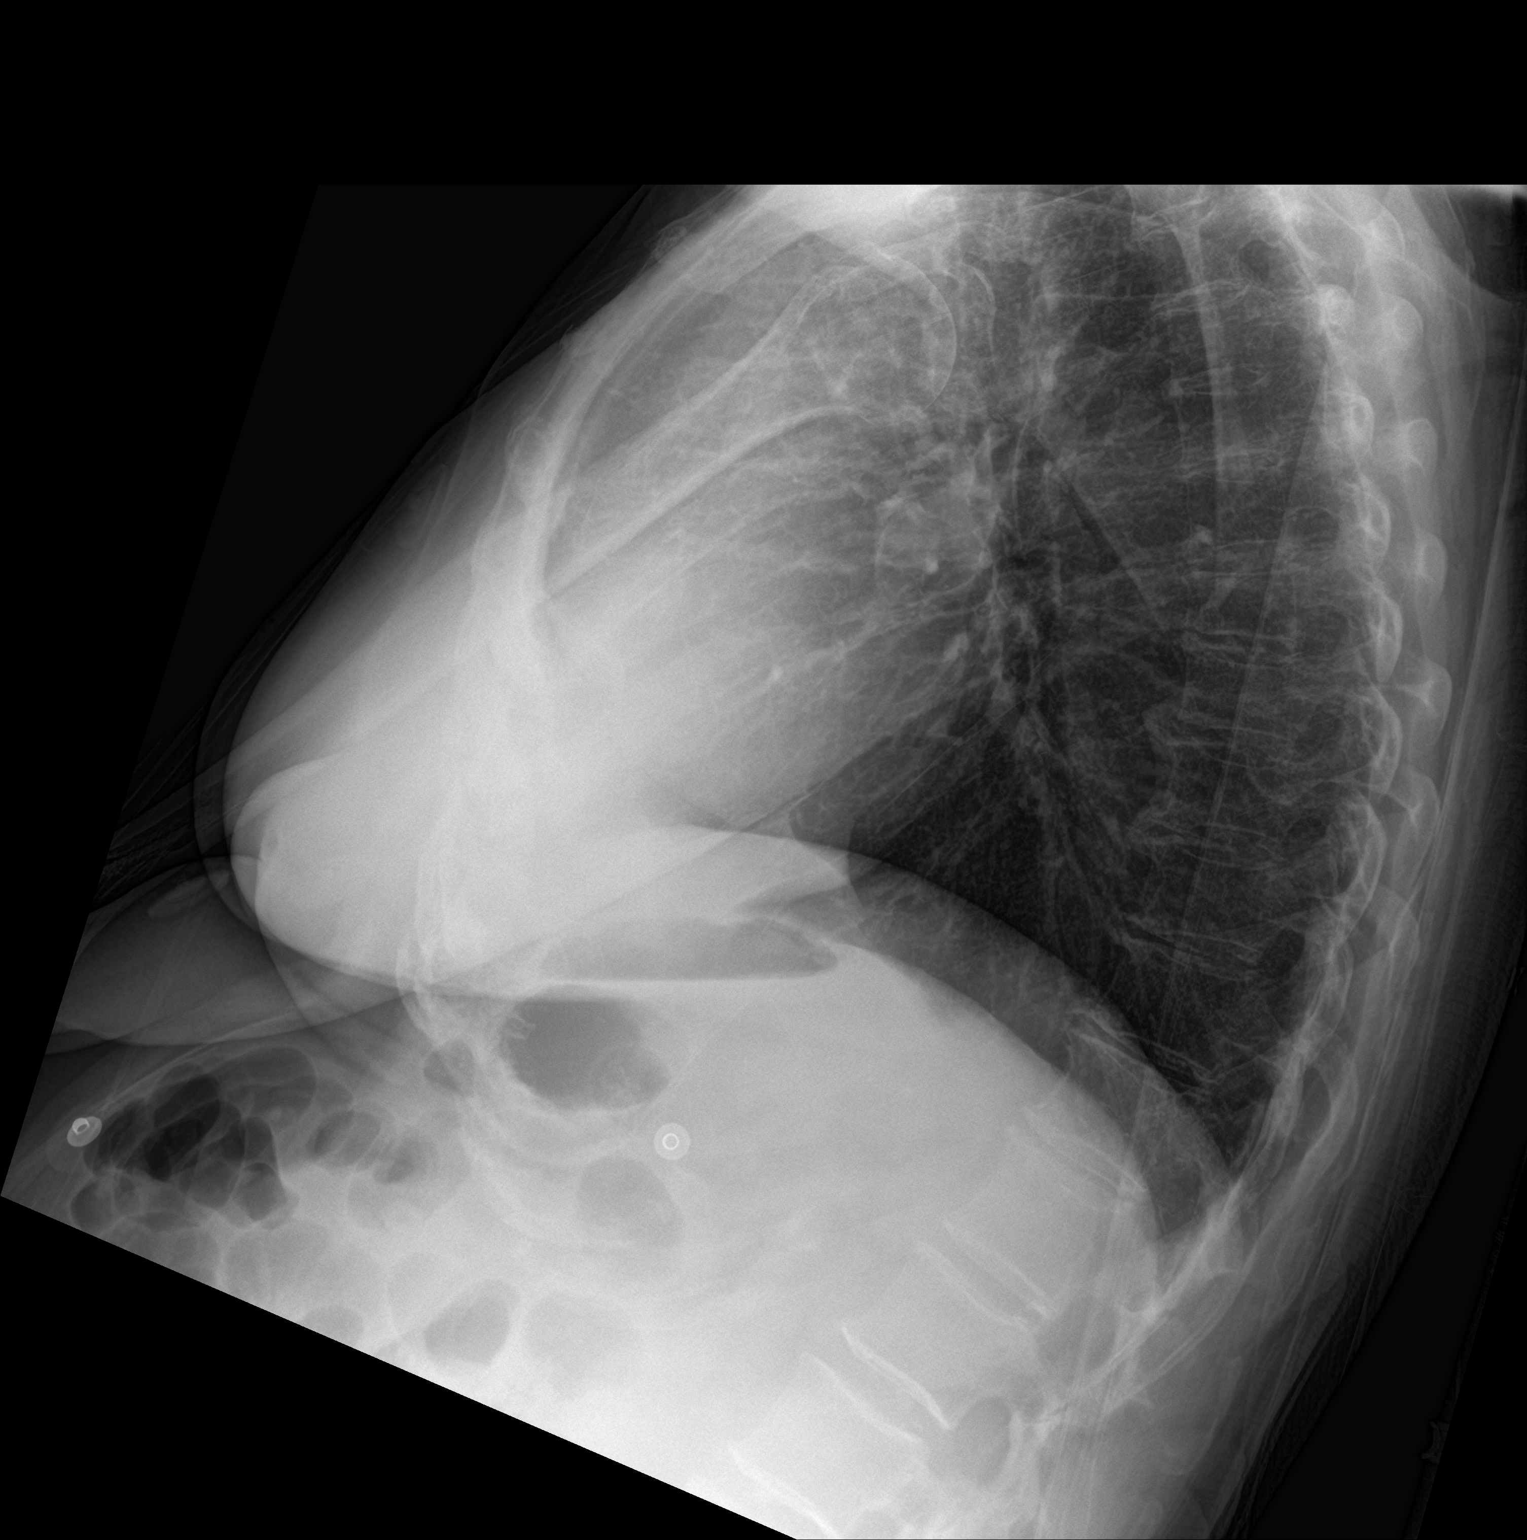

[chest ap]
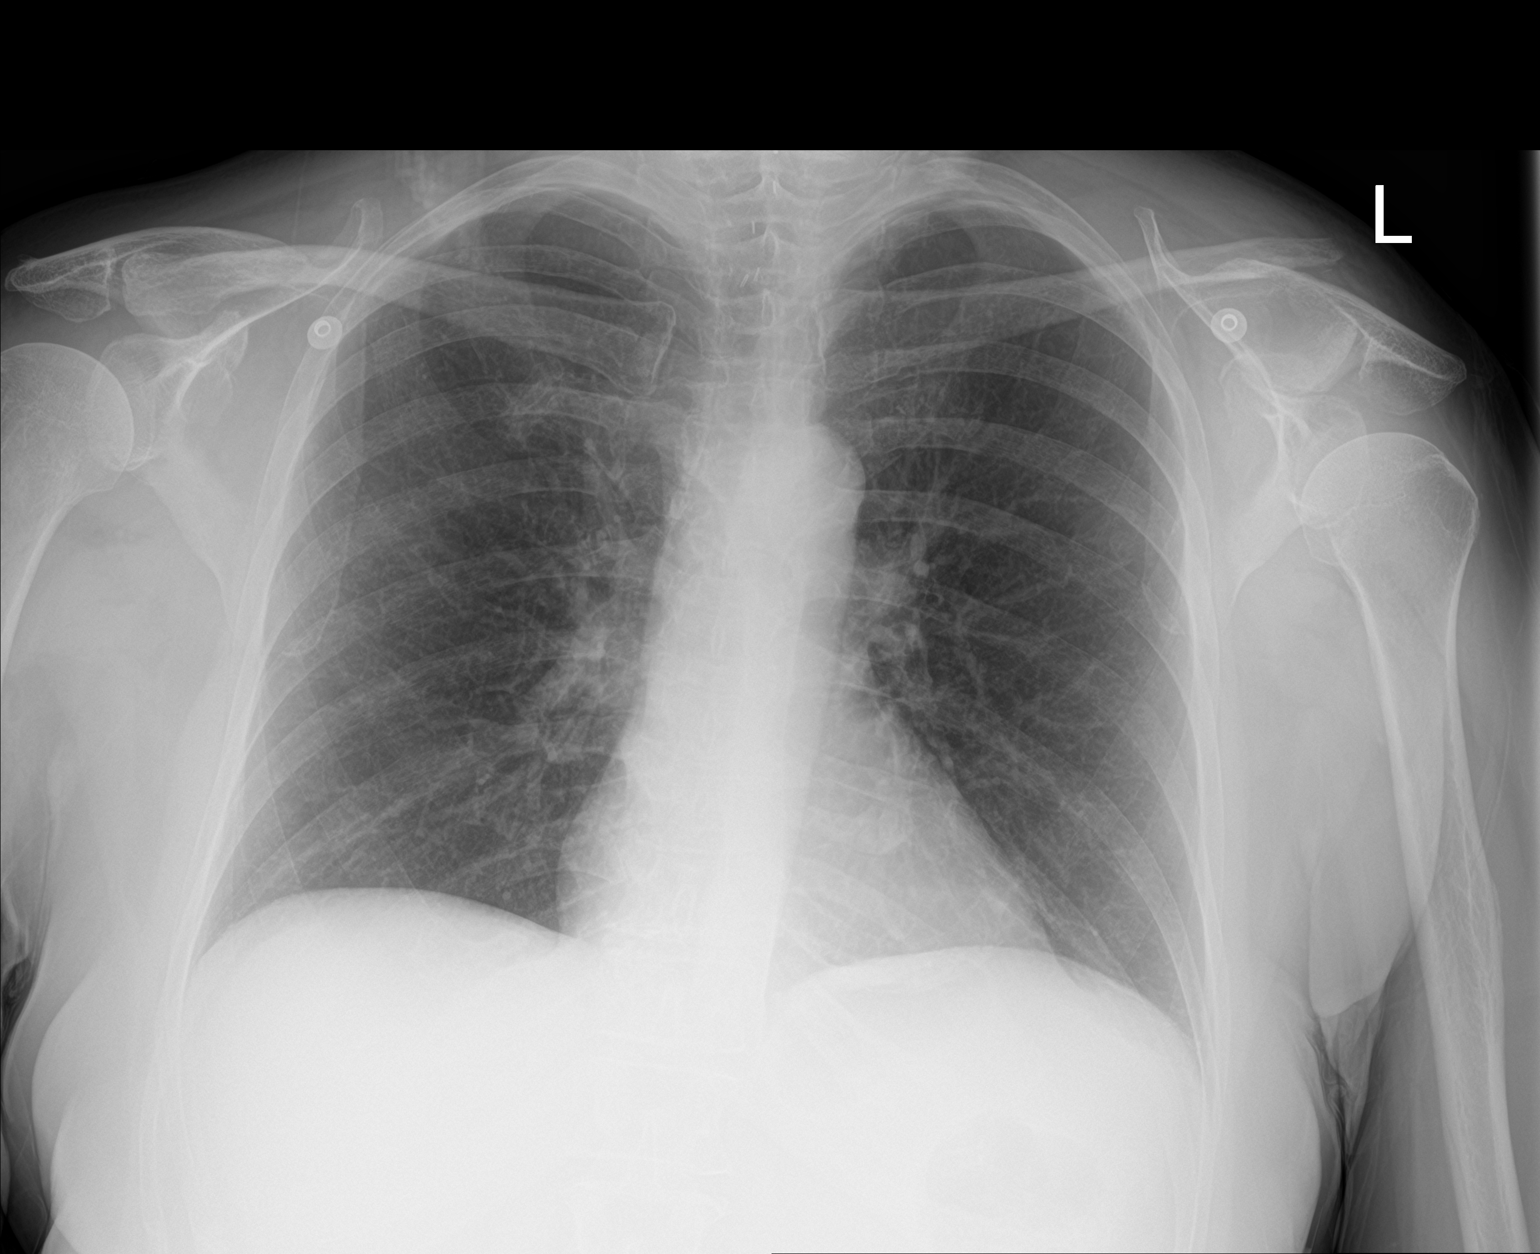

[2 of 2 positions shown; findings below may reference images not displayed]

FINDINGS: Cardiac shadows within normal limits. The lungs are clear
bilaterally. Distal left clavicular fracture is again noted.
Degenerative changes of the thoracic spine are seen. No other focal
abnormality is noted.
IMPRESSION: Distal left clavicular fracture. No other focal abnormality is
noted.

## 2022-02-19 IMAGING — CT CT HEAD W/O CM
3 series · 15 of 47 positions shown, 18 images · non-contrast
Comparison: None.

CLINICAL DATA: Syncope with forehead injury this morning

EXAM:
CT HEAD WITHOUT CONTRAST
CT CERVICAL SPINE WITHOUT CONTRAST
TECHNIQUE: Multidetector CT imaging of the head and cervical spine was
performed following the standard protocol without intravenous
contrast. Multiplanar CT image reconstructions of the cervical spine
were also generated.

[Series 3: head 5.0 h30s · axial · 0.42mm/px · z∈[-98,+32]mm · 9 of 32 slices shown, 12 images]
[im 3/32  brain]
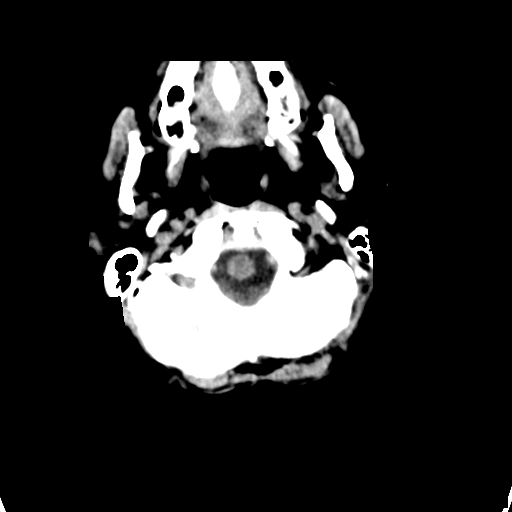
[im 3/32  bone]
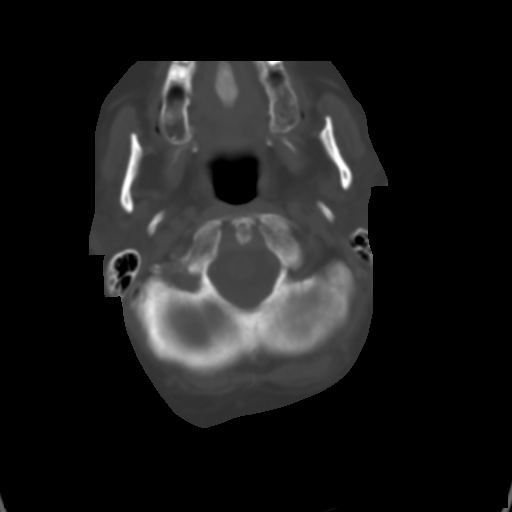
[im 6/32  brain]
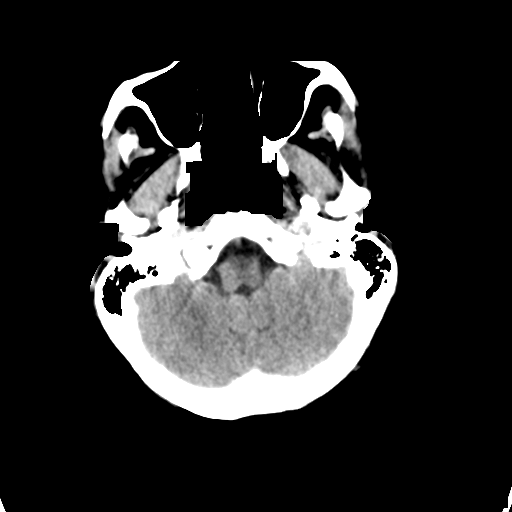
[im 9/32  brain]
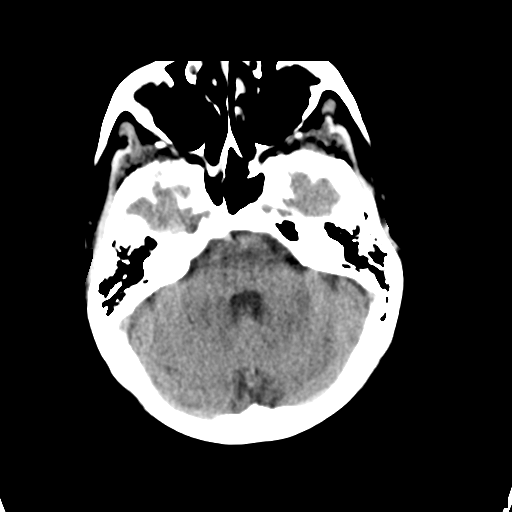
[im 12/32  brain]
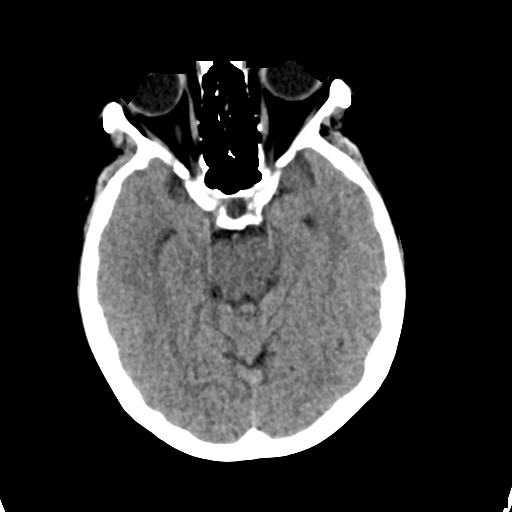
[im 17/32  brain]
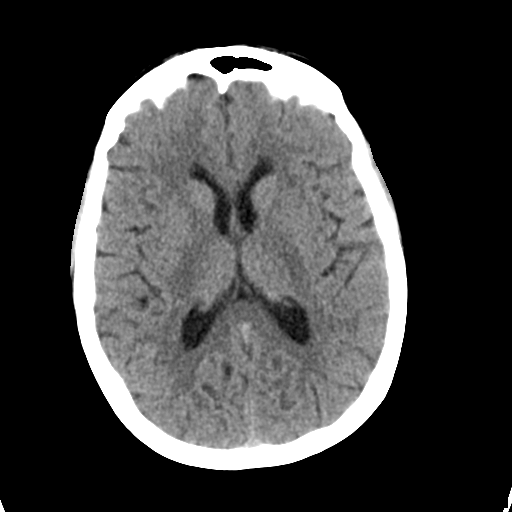
[im 17/32  bone]
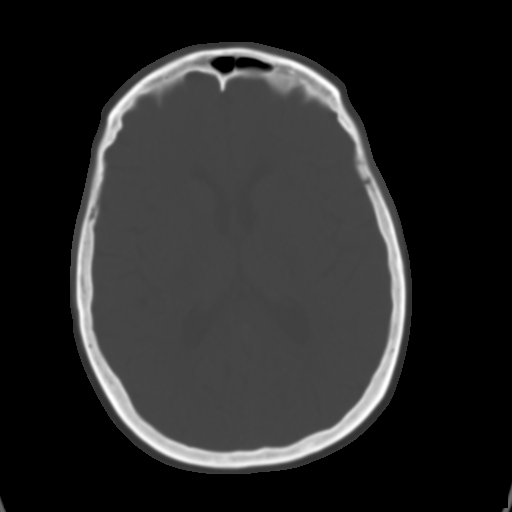
[im 20/32  brain]
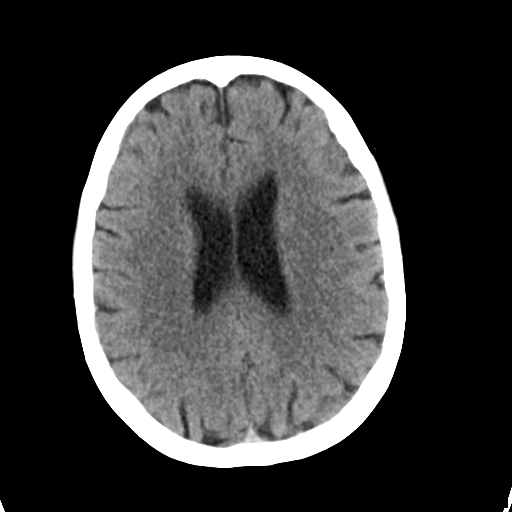
[im 23/32  brain]
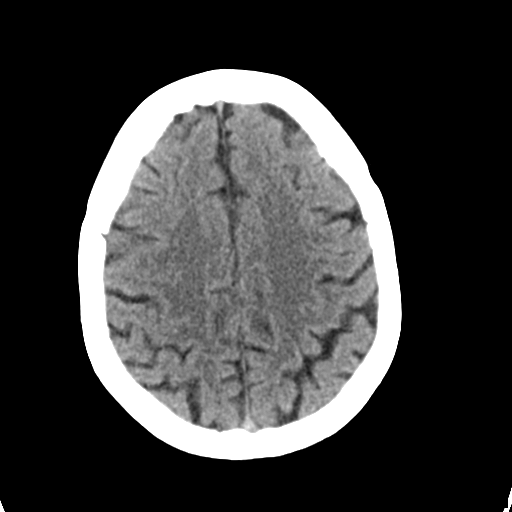
[im 26/32  brain]
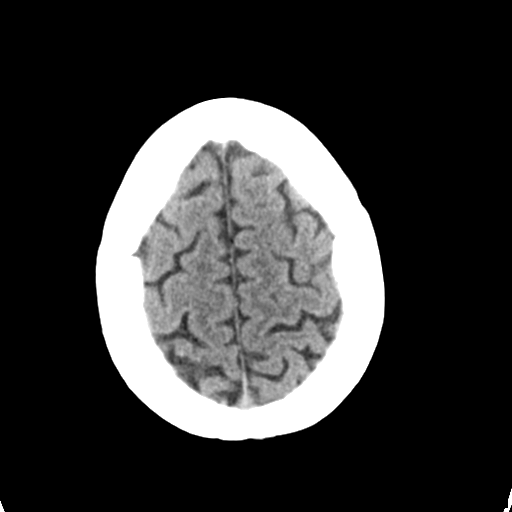
[im 29/32  brain]
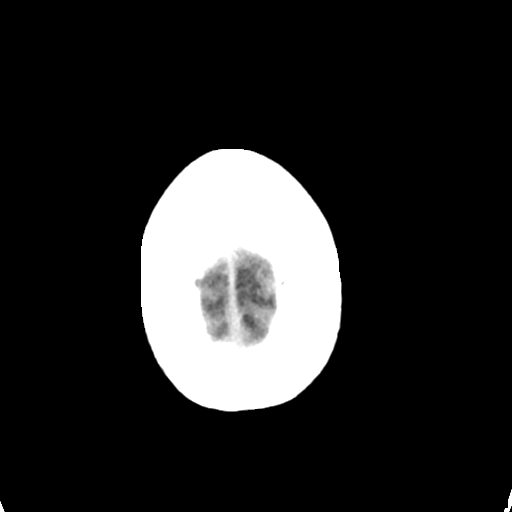
[im 29/32  bone]
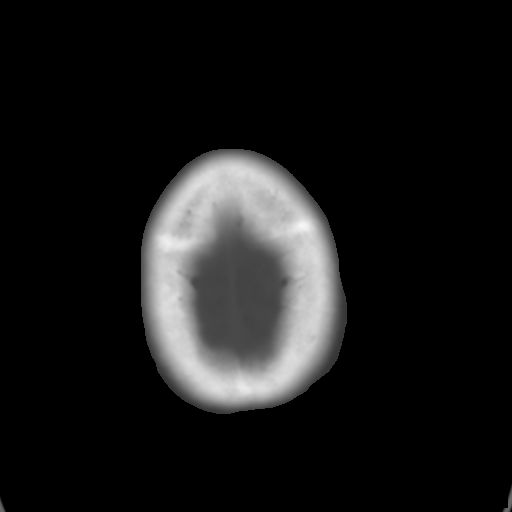

[Series 5: head 3.0 mpr cor · coronal · 0.33mm/px · 3 of 63 slices shown]
[im 21/63  brain]
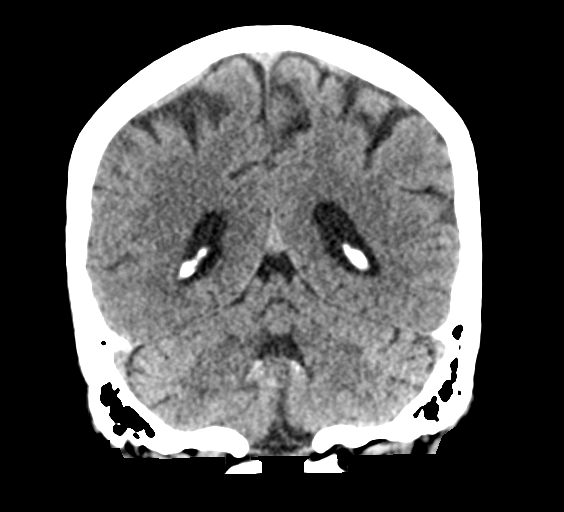
[im 28/63  brain]
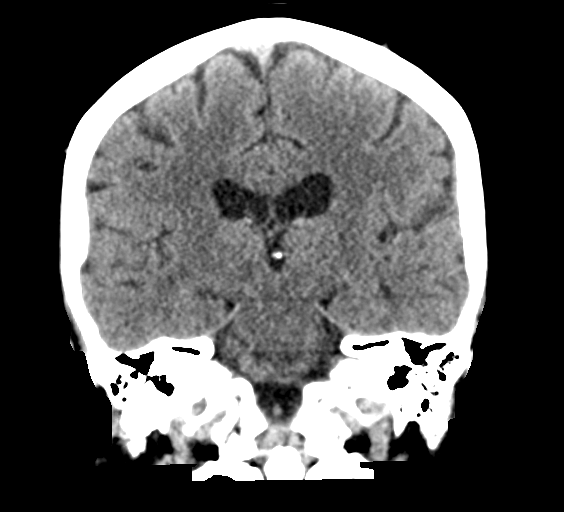
[im 35/63  brain]
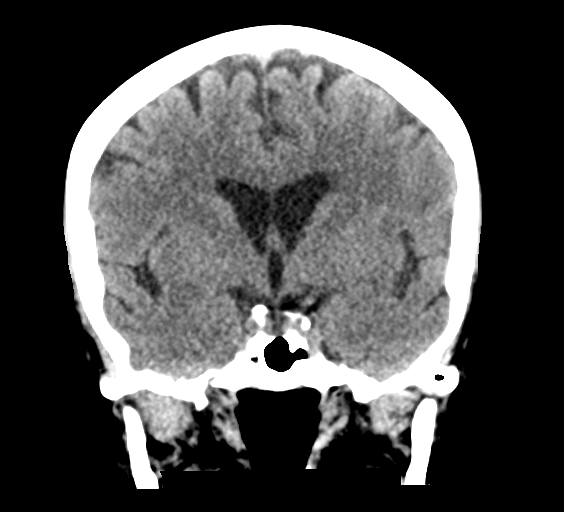

[Series 6: head 3.0 mpr sag · sagittal · 0.34mm/px · 3 of 52 slices shown]
[im 18/52  brain]
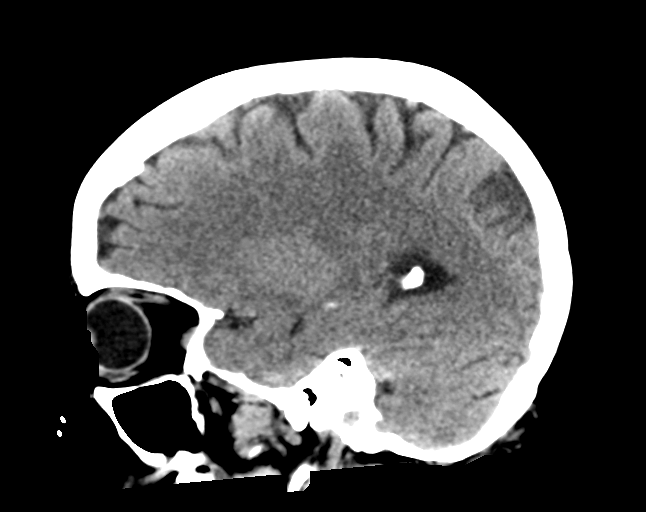
[im 26/52  brain]
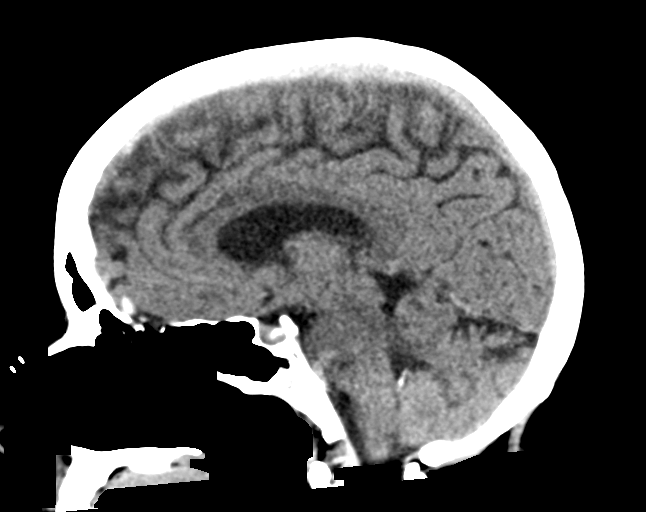
[im 35/52  brain]
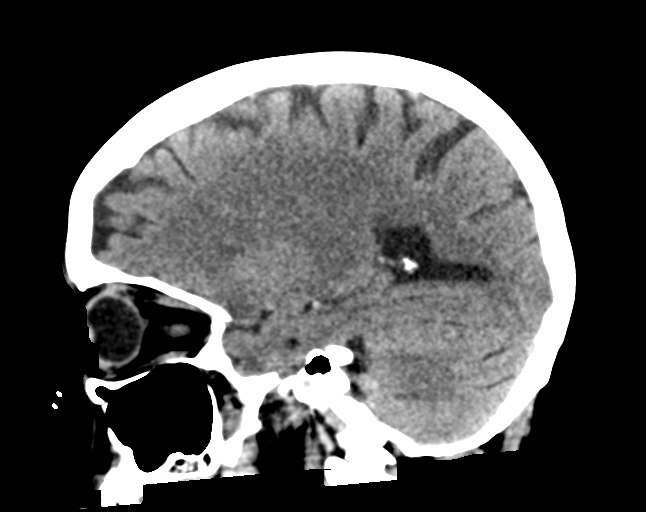

[15 of 47 positions shown; findings below may reference images not displayed]

FINDINGS: CT HEAD FINDINGS

Brain: No evidence of acute infarction, hemorrhage, hydrocephalus,
extra-axial collection or mass lesion/mass effect.

Vascular: No hyperdense vessel or unexpected calcification.

Skull: Normal. Negative for fracture or focal lesion.

Sinuses/Orbits: No acute finding.

CT CERVICAL SPINE FINDINGS

Alignment: Normal

Skull base and vertebrae: Negative for cervical spine fracture.
Nondisplaced left posterior second rib fracture, see coronal
reformats. Presumed bone island at the left occipital condyle.

Soft tissues and spinal canal: No prevertebral fluid or swelling. No
visible canal hematoma. Left hemithyroidectomy.

Disc levels:  Ordinary degenerative changes.

Upper chest: No apical pneumothorax associated with the fracture.
IMPRESSION: 1. No evidence of intracranial or cervical spine injury.
2. Nondisplaced left second rib fracture.

## 2022-06-13 DIAGNOSIS — L723 Sebaceous cyst: Secondary | ICD-10-CM | POA: Diagnosis not present

## 2022-06-23 ENCOUNTER — Other Ambulatory Visit: Payer: Self-pay | Admitting: Surgery

## 2022-06-23 DIAGNOSIS — L720 Epidermal cyst: Secondary | ICD-10-CM | POA: Diagnosis not present

## 2022-10-05 ENCOUNTER — Emergency Department (HOSPITAL_BASED_OUTPATIENT_CLINIC_OR_DEPARTMENT_OTHER): Payer: Medicare HMO

## 2022-10-05 ENCOUNTER — Emergency Department (HOSPITAL_BASED_OUTPATIENT_CLINIC_OR_DEPARTMENT_OTHER)
Admission: EM | Admit: 2022-10-05 | Discharge: 2022-10-05 | Disposition: A | Discharge status: Home / Self Care | Payer: Medicare HMO | Attending: Emergency Medicine | Admitting: Emergency Medicine

## 2022-10-05 ENCOUNTER — Other Ambulatory Visit: Payer: Self-pay

## 2022-10-05 ENCOUNTER — Encounter (HOSPITAL_BASED_OUTPATIENT_CLINIC_OR_DEPARTMENT_OTHER): Payer: Self-pay | Admitting: Emergency Medicine

## 2022-10-05 DIAGNOSIS — H9191 Unspecified hearing loss, right ear: Secondary | ICD-10-CM | POA: Diagnosis not present

## 2022-10-05 DIAGNOSIS — R42 Dizziness and giddiness: Secondary | ICD-10-CM | POA: Diagnosis not present

## 2022-10-05 DIAGNOSIS — H9311 Tinnitus, right ear: Secondary | ICD-10-CM | POA: Insufficient documentation

## 2022-10-05 DIAGNOSIS — H938X1 Other specified disorders of right ear: Secondary | ICD-10-CM | POA: Insufficient documentation

## 2022-10-05 DIAGNOSIS — R29818 Other symptoms and signs involving the nervous system: Secondary | ICD-10-CM | POA: Diagnosis not present

## 2022-10-05 LAB — CBC WITH DIFFERENTIAL/PLATELET
Abs Immature Granulocytes: 0.01 10*3/uL (ref 0.00–0.07)
Basophils Absolute: 0.1 10*3/uL (ref 0.0–0.1)
Basophils Relative: 1 %
Eosinophils Absolute: 0.1 10*3/uL (ref 0.0–0.5)
Eosinophils Relative: 1 %
HCT: 42.4 % (ref 36.0–46.0)
Hemoglobin: 14.6 g/dL (ref 12.0–15.0)
Immature Granulocytes: 0 %
Lymphocytes Relative: 22 %
Lymphs Abs: 1.3 10*3/uL (ref 0.7–4.0)
MCH: 31.9 pg (ref 26.0–34.0)
MCHC: 34.4 g/dL (ref 30.0–36.0)
MCV: 92.8 fL (ref 80.0–100.0)
Monocytes Absolute: 0.5 10*3/uL (ref 0.1–1.0)
Monocytes Relative: 8 %
Neutro Abs: 4.2 10*3/uL (ref 1.7–7.7)
Neutrophils Relative %: 68 %
Platelets: 258 10*3/uL (ref 150–400)
RBC: 4.57 MIL/uL (ref 3.87–5.11)
RDW: 12.4 % (ref 11.5–15.5)
WBC: 6.1 10*3/uL (ref 4.0–10.5)
nRBC: 0 % (ref 0.0–0.2)

## 2022-10-05 LAB — BASIC METABOLIC PANEL
Anion gap: 9 (ref 5–15)
BUN: 12 mg/dL (ref 8–23)
CO2: 27 mmol/L (ref 22–32)
Calcium: 10.8 mg/dL — ABNORMAL HIGH (ref 8.9–10.3)
Chloride: 105 mmol/L (ref 98–111)
Creatinine, Ser: 0.65 mg/dL (ref 0.44–1.00)
GFR, Estimated: >60 mL/min (ref >60)
Glucose, Bld: 106 mg/dL — ABNORMAL HIGH (ref 70–99)
Potassium: 3.7 mmol/L (ref 3.5–5.1)
Sodium: 141 mmol/L (ref 135–145)

## 2022-10-05 LAB — MAGNESIUM: Magnesium: 2.1 mg/dL (ref 1.7–2.4)

## 2022-10-05 LAB — CBG MONITORING, ED: Glucose-Capillary: 92 mg/dL (ref 70–99)

## 2022-10-05 MED ORDER — AMOXICILLIN 875 MG PO TABS: 875.0000 mg | ORAL_TABLET | Freq: Two times a day (BID) | ORAL | 0 refills | Status: AC

## 2022-10-05 MED ORDER — MECLIZINE HCL 25 MG PO TABS: 25.0000 mg | ORAL_TABLET | Freq: Three times a day (TID) | ORAL | 0 refills | Status: DC | PRN

## 2022-10-05 MED ORDER — MECLIZINE HCL 25 MG PO TABS
25.0000 mg | ORAL_TABLET | Freq: Once | ORAL | Status: AC
  Administered 2022-10-05: 25 mg via ORAL
  Filled 2022-10-05: qty 1

## 2022-10-05 NOTE — Discharge Instructions (Addendum)
Your symptoms could be due to an ear infection or could be due to Mnire's disease.  There is no evidence of wax in your ear canal.  There is no evidence of external ear infection.  Lower suspicion for acute stroke causing your presentation.  Recommend you follow-up outpatient with ENT ([facility])

## 2022-10-05 NOTE — ED Notes (Signed)
ED Provider at bedside. 

## 2022-10-05 NOTE — ED Provider Notes (Signed)
[place] EMERGENCY DEPARTMENT AT [facility] Provider Note   CSN: [id number] Arrival date & time: 10/05/22  1191     History  Chief Complaint  Patient presents with   Dizziness    Sonya Newton is a 72 y.o. female.   Dizziness    72 year old female with medical history significant for hearing loss who follows outpatient with audiology for hearing aids who presents to the emergency department with hearing loss in the right ear, ringing in the right ear and a sensation of disequilibrium.  The patient states that her symptoms have been ongoing since Monday morning.  She endorses a sensation of air blowing into her ear and subsequent loss of hearing.  She cannot hear out of the right ear.  She has had a sensation of disequilibrium and feeling off balance in addition to persistent tinnitus in the right ear.  No known history of Mnire's disease.  She denies any room spinning dizziness.  She denies any facial droop, facial numbness, focal numbness or weakness of the extremities.  She endorses a sensation of fullness in the right ear.  She denies any fevers or chills.  Home Medications Prior to Admission medications   Medication Sig Start Date End Date Taking? Authorizing Provider  amoxicillin (AMOXIL) 875 MG tablet Take 1 tablet (875 mg total) by mouth 2 (two) times daily for 5 days. 10/05/22 10/10/22 Yes Ernie Avena, MD  meclizine (ANTIVERT) 25 MG tablet Take 1 tablet (25 mg total) by mouth 3 (three) times daily as needed for dizziness. 10/05/22  Yes Ernie Avena, MD  calcium carbonate (OS-CAL) 600 MG TABS Take 600 mg by mouth daily.    [provider]  Cholecalciferol (VITAMIN D) 50 MCG (2000 UT) CAPS Take 1 capsule by mouth daily.    [provider]  Famotidine (PEPCID PO) Take 1 tablet by mouth daily.    [provider]  HYDROcodone-acetaminophen (NORCO/VICODIN) 5-325 MG tablet Take 1 tablet by mouth every 6 (six) hours as needed. 12/21/19   Tilden Fossa, MD  loratadine (CLARITIN) 10 MG tablet Take 10 mg by mouth daily.    [provider]  MULTIPLE VITAMIN PO Take 1 tablet by mouth daily.    [provider]  rosuvastatin (CRESTOR) 5 MG tablet Take 5 mg by mouth daily. 11/11/19   [provider]      Allergies    Shrimp [shellfish allergy]    Review of Systems   Review of Systems  Neurological:  Positive for dizziness.  All other systems reviewed and are negative.   Physical Exam Updated Vital Signs BP (!) 154/62   Pulse 77   Temp 98 F (36.7 C) (Oral)   Resp 17   Ht 5\' 4"  (1.626 m)   Wt 73.5 kg   LMP 03/28/2002   SpO2 98%   BMI 27.81 kg/m  Physical Exam Vitals and nursing note reviewed.  Constitutional:      General: She is not in acute distress.    Appearance: She is well-developed.  HENT:     Head: Normocephalic and atraumatic.     Right Ear: Ear canal and external ear normal. Tympanic membrane is bulging.     Left Ear: Tympanic membrane, ear canal and external ear normal.     Ears:     Comments: Mildly bulging TM on the right, no evidence of cerumen in the external auditory canal Eyes:     Conjunctiva/sclera: Conjunctivae normal.  Cardiovascular:     Rate  and Rhythm: Normal rate and regular rhythm.     Heart sounds: No murmur heard. Pulmonary:     Effort: Pulmonary effort is normal. No respiratory distress.     Breath sounds: Normal breath sounds.  Abdominal:     Palpations: Abdomen is soft.     Tenderness: There is no abdominal tenderness.  Musculoskeletal:        General: No swelling.     Cervical back: Neck supple.  Skin:    General: Skin is warm and dry.     Capillary Refill: Capillary refill takes less than 2 seconds.  Neurological:     Mental Status: She is alert.  Psychiatric:        Mood and Affect: Mood normal.     ED Results / Procedures / Treatments   Labs (all labs ordered are listed, but only abnormal results are displayed) Labs Reviewed  BASIC  METABOLIC PANEL - Abnormal; Notable for the following components:      Result Value   Glucose, Bld 106 (*)    Calcium 10.8 (*)    All other components within normal limits  CBC WITH DIFFERENTIAL/PLATELET  MAGNESIUM  CBG MONITORING, ED    EKG EKG Interpretation Date/Time:  Wednesday October 05 2022 08:04:29 EDT Ventricular Rate:  65 PR Interval:  135 QRS Duration:  95 QT Interval:  384 QTC Calculation: 400 R Axis:   2  Text Interpretation: Sinus rhythm Confirmed by Ernie Avena ([id number]) on 10/05/2022 8:27:20 AM  Radiology CT Head Wo Contrast  Result Date: 10/05/2022 CLINICAL DATA:  Neuro deficit, acute, stroke suspected EXAM: CT HEAD WITHOUT CONTRAST TECHNIQUE: Contiguous axial images were obtained from the base of the skull through the vertex without intravenous contrast. RADIATION DOSE REDUCTION: This exam was performed according to the departmental dose-optimization program which includes automated exposure control, adjustment of the mA and/or kV according to patient size and/or use of iterative reconstruction technique. COMPARISON:  CT Head 12/21/19 FINDINGS: Brain: No evidence of acute infarction, hemorrhage, hydrocephalus, extra-axial collection or mass lesion/mass effect. Vascular: No hyperdense vessel or unexpected calcification. Skull: Normal. Negative for fracture or focal lesion. Sinuses/Orbits: No middle ear or mastoid effusion. Paranasal sinuses are clear. Orbits are unremarkable. Other: None. IMPRESSION: No acute intracranial abnormality. Electronically Signed   By: Lorenza Cambridge M.D.   On: 10/05/2022 08:42    Procedures Procedures    Medications Ordered in ED Medications  meclizine (ANTIVERT) tablet 25 mg (25 mg Oral Given 10/05/22 0846)    ED Course/ Medical Decision Making/ A&P                             Medical Decision Making Amount and/or Complexity of Data Reviewed Labs: ordered. Radiology: ordered.  Risk Prescription drug management.    72 year old  female with medical history significant for hearing loss who follows outpatient with audiology for hearing aids who presents to the emergency department with hearing loss in the right ear, ringing in the right ear and a sensation of disequilibrium.  The patient states that her symptoms have been ongoing since Monday morning.  She endorses a sensation of air blowing into her ear and subsequent loss of hearing.  She cannot hear out of the right ear.  She has had a sensation of disequilibrium and feeling off balance in addition to persistent tinnitus in the right ear.  No known history of Mnire's disease.  She denies any room spinning dizziness.  She denies  any facial droop, facial numbness, focal numbness or weakness of the extremities.  She endorses a sensation of fullness in the right ear.  She denies any fevers or chills.  on arrival, the patient was vitally stable, afebrile, not tachycardic or tachypneic, initially hypertensive BP 194/87, subsequent improved to 154/62 without significant intervention.  Patient presenting with evidence of hearing loss, tinnitus, sensation of fullness in the right ear and disequilibrium.  Physical exam significant for a normal neurologic exam.  Mildly bulging TM was noted on the right with no evidence of cerumen impaction.  Differential diagnosis includes otitis media versus potentially undiagnosed Mnire's disease.  Low suspicion for CVA at this time.  CT head was performed which was unremarkable.  Laboratory evaluation with no evidence of electrolyte abnormality, normal CBC, BMP generally unremarkable with mild elevated calcium at 10.8, CBG 92.  Do not think that further evaluation with MRI imaging is indicated at this time.  I recommended the patient follow-up outpatient for repeat assessment with ENT given the findings of ear fullness, disequilibrium and hearing loss with associated tinnitus as this could be a presentation consistent with Mnire's disease.  Will trial  a prescription for amoxicillin to treat for developing otitis media.  Advised continued outpatient follow-up, return precautions provided.  Stable for discharge.   Final Clinical Impression(s) / ED Diagnoses Final diagnoses:  Tinnitus of right ear  Sensation of fullness in right ear  Hearing loss of right ear, unspecified hearing loss type  Dysequilibrium    Rx / DC Orders ED Discharge Orders          Ordered    Ambulatory referral to ENT        10/05/22 0821    meclizine (ANTIVERT) 25 MG tablet  3 times daily PRN        10/05/22 0822    amoxicillin (AMOXIL) 875 MG tablet  2 times daily        10/05/22 0939              Ernie Avena, MD 10/05/22 [phone]

## 2022-10-05 NOTE — ED Notes (Signed)
Discharge paperwork given and verbally understood. 

## 2022-10-05 NOTE — ED Triage Notes (Signed)
Patient arrives ambulatory by POV with spouse states Monday morning around 8am began having tingling to her right ear then felt like air blowing into her ear and then lost hearing. Patient wears hearing aids but still cannot hear with them. Patient states she has had intermittent tingling to bilateral arms. No tingling at this time. States she feels off balance and dizzy at this time.

## 2022-10-06 DIAGNOSIS — H903 Sensorineural hearing loss, bilateral: Secondary | ICD-10-CM | POA: Diagnosis not present

## 2022-10-27 DIAGNOSIS — H9121 Sudden idiopathic hearing loss, right ear: Secondary | ICD-10-CM | POA: Diagnosis not present

## 2022-10-27 DIAGNOSIS — H903 Sensorineural hearing loss, bilateral: Secondary | ICD-10-CM | POA: Diagnosis not present

## 2022-10-31 DIAGNOSIS — H25013 Cortical age-related cataract, bilateral: Secondary | ICD-10-CM | POA: Diagnosis not present

## 2022-10-31 DIAGNOSIS — H2513 Age-related nuclear cataract, bilateral: Secondary | ICD-10-CM | POA: Diagnosis not present

## 2022-10-31 DIAGNOSIS — H43812 Vitreous degeneration, left eye: Secondary | ICD-10-CM | POA: Diagnosis not present

## 2022-11-22 DIAGNOSIS — M545 Low back pain, unspecified: Secondary | ICD-10-CM | POA: Diagnosis not present

## 2022-12-08 DIAGNOSIS — Z6829 Body mass index (BMI) 29.0-29.9, adult: Secondary | ICD-10-CM | POA: Diagnosis not present

## 2022-12-08 DIAGNOSIS — Z01419 Encounter for gynecological examination (general) (routine) without abnormal findings: Secondary | ICD-10-CM | POA: Diagnosis not present

## 2022-12-08 DIAGNOSIS — Z1231 Encounter for screening mammogram for malignant neoplasm of breast: Secondary | ICD-10-CM | POA: Diagnosis not present

## 2022-12-21 DIAGNOSIS — Z0000 Encounter for general adult medical examination without abnormal findings: Secondary | ICD-10-CM | POA: Diagnosis not present

## 2022-12-21 DIAGNOSIS — Z131 Encounter for screening for diabetes mellitus: Secondary | ICD-10-CM | POA: Diagnosis not present

## 2022-12-21 DIAGNOSIS — E785 Hyperlipidemia, unspecified: Secondary | ICD-10-CM | POA: Diagnosis not present

## 2022-12-21 DIAGNOSIS — Z79899 Other long term (current) drug therapy: Secondary | ICD-10-CM | POA: Diagnosis not present

## 2023-01-16 DIAGNOSIS — R350 Frequency of micturition: Secondary | ICD-10-CM | POA: Diagnosis not present

## 2023-01-16 DIAGNOSIS — R319 Hematuria, unspecified: Secondary | ICD-10-CM | POA: Diagnosis not present

## 2023-02-09 DIAGNOSIS — R319 Hematuria, unspecified: Secondary | ICD-10-CM | POA: Diagnosis not present

## 2023-05-12 ENCOUNTER — Emergency Department (HOSPITAL_BASED_OUTPATIENT_CLINIC_OR_DEPARTMENT_OTHER)
Admission: EM | Admit: 2023-05-12 | Discharge: 2023-05-12 | Disposition: A | Discharge status: Home / Self Care | Payer: Medicare HMO | Attending: Emergency Medicine | Admitting: Emergency Medicine

## 2023-05-12 ENCOUNTER — Emergency Department (HOSPITAL_BASED_OUTPATIENT_CLINIC_OR_DEPARTMENT_OTHER): Payer: Medicare HMO

## 2023-05-12 ENCOUNTER — Encounter (HOSPITAL_BASED_OUTPATIENT_CLINIC_OR_DEPARTMENT_OTHER): Payer: Self-pay

## 2023-05-12 DIAGNOSIS — R42 Dizziness and giddiness: Secondary | ICD-10-CM | POA: Insufficient documentation

## 2023-05-12 DIAGNOSIS — R29818 Other symptoms and signs involving the nervous system: Secondary | ICD-10-CM | POA: Diagnosis not present

## 2023-05-12 DIAGNOSIS — I639 Cerebral infarction, unspecified: Secondary | ICD-10-CM | POA: Diagnosis not present

## 2023-05-12 DIAGNOSIS — S0990XA Unspecified injury of head, initial encounter: Secondary | ICD-10-CM | POA: Diagnosis not present

## 2023-05-12 LAB — CBC
HCT: 41.6 % (ref 36.0–46.0)
Hemoglobin: 14 g/dL (ref 12.0–15.0)
MCH: 31.5 pg (ref 26.0–34.0)
MCHC: 33.7 g/dL (ref 30.0–36.0)
MCV: 93.5 fL (ref 80.0–100.0)
Platelets: 235 10*3/uL (ref 150–400)
RBC: 4.45 MIL/uL (ref 3.87–5.11)
RDW: 12.6 % (ref 11.5–15.5)
WBC: 6.9 10*3/uL (ref 4.0–10.5)
nRBC: 0 % (ref 0.0–0.2)

## 2023-05-12 LAB — BASIC METABOLIC PANEL
Anion gap: 8 (ref 5–15)
BUN: 13 mg/dL (ref 8–23)
CO2: 26 mmol/L (ref 22–32)
Calcium: 9.5 mg/dL (ref 8.9–10.3)
Chloride: 107 mmol/L (ref 98–111)
Creatinine, Ser: 0.76 mg/dL (ref 0.44–1.00)
GFR, Estimated: >60 mL/min (ref >60)
Glucose, Bld: 119 mg/dL — ABNORMAL HIGH (ref 70–99)
Potassium: 3.8 mmol/L (ref 3.5–5.1)
Sodium: 141 mmol/L (ref 135–145)

## 2023-05-12 LAB — URINALYSIS, ROUTINE W REFLEX MICROSCOPIC
Bilirubin Urine: NEGATIVE
Glucose, UA: NEGATIVE mg/dL
Hgb urine dipstick: NEGATIVE
Ketones, ur: NEGATIVE mg/dL
Leukocytes,Ua: NEGATIVE
Nitrite: NEGATIVE
Protein, ur: NEGATIVE mg/dL
Specific Gravity, Urine: 1.005 (ref 1.005–1.030)
pH: 7 (ref 5.0–8.0)

## 2023-05-12 LAB — CBG MONITORING, ED: Glucose-Capillary: 126 mg/dL — ABNORMAL HIGH (ref 70–99)

## 2023-05-12 MED ORDER — MECLIZINE HCL 25 MG PO TABS: 25.0000 mg | ORAL_TABLET | Freq: Three times a day (TID) | ORAL | 0 refills | Status: AC | PRN

## 2023-05-12 NOTE — ED Notes (Signed)
Denies dizziness, needs met at this time. Family at bedside

## 2023-05-12 NOTE — ED Provider Notes (Addendum)
[place] EMERGENCY DEPARTMENT AT [facility] Provider Note   CSN: [id number] Arrival date & time: 05/12/23  1700     History Chief Complaint  Patient presents with   Dizziness    HPI Sonya Newton is a 73 y.o. female presenting for sudden onset of dizziness today at 2pm. Describes vertiginous dizziness sudden onset. Now improved.  Denies any visual or motorsensory symptoms. HX of similar 2/2 BPPV  Patient's recorded medical, surgical, social, medication list and allergies were reviewed in the Snapshot window as part of the initial history.   Review of Systems   Review of Systems  Constitutional:  Negative for chills and fever.  HENT:  Negative for ear pain and sore throat.   Eyes:  Negative for pain and visual disturbance.  Respiratory:  Negative for cough and shortness of breath.   Cardiovascular:  Negative for chest pain and palpitations.  Gastrointestinal:  Negative for abdominal pain and vomiting.  Genitourinary:  Negative for dysuria and hematuria.  Musculoskeletal:  Negative for arthralgias and back pain.  Skin:  Negative for color change and rash.  Neurological:  Positive for dizziness. Negative for seizures and syncope.  All other systems reviewed and are negative.   Physical Exam Updated Vital Signs BP (!) 140/73   Pulse 72   Temp 97.9 F (36.6 C)   Resp 16   Ht 5\' 3"  (1.6 m)   Wt 78 kg   LMP 03/28/2002   SpO2 98%   BMI 30.47 kg/m  Physical Exam Vitals and nursing note reviewed.  Constitutional:      General: She is not in acute distress.    Appearance: She is well-developed.  HENT:     Head: Normocephalic and atraumatic.  Eyes:     Conjunctiva/sclera: Conjunctivae normal.  Cardiovascular:     Rate and Rhythm: Normal rate and regular rhythm.     Heart sounds: No murmur heard. Pulmonary:     Effort: Pulmonary effort is normal. No respiratory distress.     Breath sounds: Normal breath sounds.  Abdominal:     General: There is no  distension.     Palpations: Abdomen is soft.     Tenderness: There is no abdominal tenderness. There is no right CVA tenderness or left CVA tenderness.  Musculoskeletal:        General: No swelling or tenderness. Normal range of motion.     Cervical back: Neck supple.  Skin:    General: Skin is warm and dry.  Neurological:     General: No focal deficit present.     Mental Status: She is alert and oriented to person, place, and time. Mental status is at baseline.     Cranial Nerves: No cranial nerve deficit.     ED Course/ Medical Decision Making/ A&P    Procedures Procedures   Medications Ordered in ED Medications - No data to display Medical Decision Making:   BRISEIS AGUILERA is a 73 y.o. female who presented to the ED today with dizziness detailed above.    Patient placed on continuous vitals and telemetry monitoring while in ED which was reviewed periodically.  Complete initial physical exam performed, notably the patient  was HDS in NAD.    I personally performed a HINTS exam Head impulse test: Patient does have a corrective saccade to the left. Nystagmus: Patient does have nystagmus to the left on contralateral directional gaze. Test of skew: No skew. Hints exam localizes to the PERIPHERAL nervous system.  Reviewed  and confirmed nursing documentation for past medical history, family history, social history.    Initial Assessment:   With the patient's presentation of episodic, intermittent dizziness that is worsened with motion activity, most likely diagnosis is BPPV. Other diagnoses were considered including (but not limited to) CVA, Labyrinthitis, Vestibular Neuritis, Mnire's disease, occipital migraine, Inner Ear infection. These are considered less likely due to history of present illness and physical exam findings.   This is most consistent with an acute life/limb threatening illness complicated by underlying chronic conditions. Notably, due to duration of s3ymptoms  being greater than 4 and half hours, patient is not a candidate for code stroke activation.  Initial Plan:  CT head to evaluate for intracranial hemorrhage or intracranial mass as etiology. Due to duration of symptoms, would expect reasonable sensitivity of this test for CVA.  Screening labs including CBC and Metabolic panel to evaluate for infectious or metabolic etiology of disease.  Urinalysis with reflex culture ordered to evaluate for UTI or relevant urologic/nephrologic pathology.  CXR to evaluate for structural/infectious intrathoracic pathology.  EKG to evaluate for cardiac pathology Objective evaluation as below reviewed with plan for reassessment after administration of Meclizine.  Initial Study Results:   Laboratory  All laboratory results reviewed without evidence of clinically relevant pathology.    EKG EKG was reviewed independently. Rate, rhythm, axis, intervals all examined and without medically relevant abnormality. ST segments without concerns for elevations.    Radiology:  All images reviewed independently. Agree with radiology report at this time.   CT HEAD WO CONTRAST ( ) Result Date: 05/12/2023 CLINICAL DATA:  Head trauma, minor. Neuro deficit, acute, stroke suspected. Dizziness beginning today. EXAM: CT HEAD WITHOUT CONTRAST TECHNIQUE: Contiguous axial images were obtained from the base of the skull through the vertex without intravenous contrast. RADIATION DOSE REDUCTION: This exam was performed according to the departmental dose-optimization program which includes automated exposure control, adjustment of the mA and/or kV according to patient size and/or use of iterative reconstruction technique. COMPARISON:  10/05/2022 FINDINGS: Brain: The brain shows a normal appearance without evidence of malformation, atrophy, old or acute small or large vessel infarction, mass lesion, hemorrhage, hydrocephalus or extra-axial collection. Vascular: No hyperdense vessel. No evidence  of atherosclerotic calcification. Skull: Normal.  No traumatic finding.  No focal bone lesion. Sinuses/Orbits: Sinuses are clear. Orbits appear normal. Mastoids are clear. Other: None significant IMPRESSION: Normal head CT. Electronically Signed   By: Paulina Fusi M.D.   On: 05/12/2023 19:27   Reassessment and Plan:   No new symptoms on reassessment. Requesting immediate discharge at this time   Disposition:  I have considered need for hospitalization, however, considering all of the above, I believe this patient is stable for discharge at this time.  Patient/family educated about specific return precautions for given chief complaint and symptoms.  Patient/family educated about follow-up with PCP and neurology.     Patient/family expressed understanding of return precautions and need for follow-up. Patient spoken to regarding all imaging and laboratory results and appropriate follow up for these results. All education provided in verbal form with additional information in written form. Time was allowed for answering of patient questions. Patient discharged.    Emergency Department Medication Summary:   Medications - No data to display       Clinical Impression:  1. Vertigo      Discharge   Final Clinical Impression(s) / ED Diagnoses Final diagnoses:  Vertigo    Rx / DC Orders ED Discharge Orders  Ordered    meclizine (ANTIVERT) 25 MG tablet  3 times daily PRN        05/12/23 1800              Glyn Ade, MD 05/12/23 1610    Glyn Ade, MD 05/12/23 2304

## 2023-05-12 NOTE — ED Triage Notes (Signed)
Dizziness onset today but now getting better.  Dizziness worse with walking.  Better with sitting  Ambulatory to triage without difficulty Sent by MD states to get "heart checked out"

## 2023-05-12 NOTE — ED Notes (Signed)
Awaiting dispo, pt without complaints

## 2023-05-24 DIAGNOSIS — R300 Dysuria: Secondary | ICD-10-CM | POA: Diagnosis not present

## 2023-05-24 DIAGNOSIS — R319 Hematuria, unspecified: Secondary | ICD-10-CM | POA: Diagnosis not present

## 2023-12-18 DIAGNOSIS — H5213 Myopia, bilateral: Secondary | ICD-10-CM | POA: Diagnosis not present

## 2023-12-18 DIAGNOSIS — H2513 Age-related nuclear cataract, bilateral: Secondary | ICD-10-CM | POA: Diagnosis not present

## 2023-12-18 DIAGNOSIS — H01131 Eczematous dermatitis of right upper eyelid: Secondary | ICD-10-CM | POA: Diagnosis not present

## 2024-01-03 DIAGNOSIS — E785 Hyperlipidemia, unspecified: Secondary | ICD-10-CM | POA: Diagnosis not present

## 2024-01-03 DIAGNOSIS — Z131 Encounter for screening for diabetes mellitus: Secondary | ICD-10-CM | POA: Diagnosis not present

## 2024-01-03 DIAGNOSIS — Z79899 Other long term (current) drug therapy: Secondary | ICD-10-CM | POA: Diagnosis not present

## 2024-02-12 DIAGNOSIS — L900 Lichen sclerosus et atrophicus: Secondary | ICD-10-CM | POA: Diagnosis not present
